# Patient Record
Sex: Female | Born: 1970 | Race: Black or African American | Hispanic: No | Marital: Single | Smoking: Current every day smoker
Health system: Southern US, Community
[De-identification: ages and names within clinical notes are randomized; demographics above are authoritative.]

## PROBLEM LIST (undated history)

## (undated) DIAGNOSIS — I10 Essential (primary) hypertension: Secondary | ICD-10-CM

## (undated) DIAGNOSIS — C4490 Unspecified malignant neoplasm of skin, unspecified: Secondary | ICD-10-CM

## (undated) DIAGNOSIS — M1990 Unspecified osteoarthritis, unspecified site: Secondary | ICD-10-CM

## (undated) DIAGNOSIS — C801 Malignant (primary) neoplasm, unspecified: Secondary | ICD-10-CM

## (undated) HISTORY — PX: CHOLECYSTECTOMY: SHX55

## (undated) HISTORY — PX: TUBAL LIGATION: SHX77

---

## 2005-11-15 ENCOUNTER — Emergency Department (HOSPITAL_COMMUNITY): Admission: EM | Admit: 2005-11-15 | Discharge: 2005-11-15 | Payer: Self-pay | Admitting: Emergency Medicine

## 2006-01-07 ENCOUNTER — Emergency Department (HOSPITAL_COMMUNITY): Admission: EM | Admit: 2006-01-07 | Discharge: 2006-01-08 | Payer: Self-pay

## 2008-08-04 ENCOUNTER — Emergency Department (HOSPITAL_BASED_OUTPATIENT_CLINIC_OR_DEPARTMENT_OTHER): Admission: EM | Admit: 2008-08-04 | Discharge: 2008-08-04 | Payer: Self-pay | Admitting: Emergency Medicine

## 2008-08-04 ENCOUNTER — Ambulatory Visit: Payer: Self-pay | Admitting: Radiology

## 2008-10-17 ENCOUNTER — Emergency Department (HOSPITAL_BASED_OUTPATIENT_CLINIC_OR_DEPARTMENT_OTHER): Admission: EM | Admit: 2008-10-17 | Discharge: 2008-10-17 | Payer: Self-pay | Admitting: Emergency Medicine

## 2009-07-10 ENCOUNTER — Emergency Department (HOSPITAL_BASED_OUTPATIENT_CLINIC_OR_DEPARTMENT_OTHER): Admission: EM | Admit: 2009-07-10 | Discharge: 2009-07-10 | Payer: Self-pay | Admitting: Emergency Medicine

## 2009-07-10 ENCOUNTER — Ambulatory Visit: Payer: Self-pay | Admitting: Diagnostic Radiology

## 2009-10-23 ENCOUNTER — Emergency Department (HOSPITAL_BASED_OUTPATIENT_CLINIC_OR_DEPARTMENT_OTHER): Admission: EM | Admit: 2009-10-23 | Discharge: 2009-10-24 | Payer: Self-pay | Admitting: Emergency Medicine

## 2009-11-06 ENCOUNTER — Emergency Department (HOSPITAL_BASED_OUTPATIENT_CLINIC_OR_DEPARTMENT_OTHER): Admission: EM | Admit: 2009-11-06 | Discharge: 2009-11-06 | Payer: Self-pay | Admitting: Emergency Medicine

## 2010-05-22 ENCOUNTER — Emergency Department (HOSPITAL_BASED_OUTPATIENT_CLINIC_OR_DEPARTMENT_OTHER)
Admission: EM | Admit: 2010-05-22 | Discharge: 2010-05-22 | Payer: Self-pay | Source: Home / Self Care | Admitting: Emergency Medicine

## 2010-08-19 LAB — WOUND CULTURE: Gram Stain: NONE SEEN

## 2010-09-10 LAB — URINE CULTURE: Colony Count: >=100000

## 2010-11-13 ENCOUNTER — Emergency Department (INDEPENDENT_AMBULATORY_CARE_PROVIDER_SITE_OTHER): Payer: Self-pay

## 2010-11-13 ENCOUNTER — Emergency Department (HOSPITAL_BASED_OUTPATIENT_CLINIC_OR_DEPARTMENT_OTHER)
Admission: EM | Admit: 2010-11-13 | Discharge: 2010-11-13 | Disposition: A | Discharge status: Home / Self Care | Payer: Self-pay | Attending: Emergency Medicine | Admitting: Emergency Medicine

## 2010-11-13 DIAGNOSIS — M25569 Pain in unspecified knee: Secondary | ICD-10-CM

## 2010-11-13 DIAGNOSIS — M79609 Pain in unspecified limb: Secondary | ICD-10-CM

## 2010-11-13 DIAGNOSIS — F17200 Nicotine dependence, unspecified, uncomplicated: Secondary | ICD-10-CM | POA: Insufficient documentation

## 2010-11-13 DIAGNOSIS — M7989 Other specified soft tissue disorders: Secondary | ICD-10-CM

## 2011-03-27 ENCOUNTER — Emergency Department (HOSPITAL_BASED_OUTPATIENT_CLINIC_OR_DEPARTMENT_OTHER)
Admission: EM | Admit: 2011-03-27 | Discharge: 2011-03-27 | Disposition: A | Discharge status: Home / Self Care | Payer: Self-pay | Attending: Emergency Medicine | Admitting: Emergency Medicine

## 2011-03-27 ENCOUNTER — Encounter: Payer: Self-pay | Admitting: *Deleted

## 2011-03-27 DIAGNOSIS — K089 Disorder of teeth and supporting structures, unspecified: Secondary | ICD-10-CM | POA: Insufficient documentation

## 2011-03-27 DIAGNOSIS — F17200 Nicotine dependence, unspecified, uncomplicated: Secondary | ICD-10-CM | POA: Insufficient documentation

## 2011-03-27 DIAGNOSIS — K0889 Other specified disorders of teeth and supporting structures: Secondary | ICD-10-CM

## 2011-03-27 MED ORDER — OXYCODONE-ACETAMINOPHEN 5-325 MG PO TABS: 1.0000 | ORAL_TABLET | Freq: Four times a day (QID) | ORAL | Status: AC | PRN

## 2011-03-27 MED ORDER — PENICILLIN V POTASSIUM 500 MG PO TABS: 500.0000 mg | ORAL_TABLET | Freq: Three times a day (TID) | ORAL | Status: AC

## 2011-03-27 NOTE — ED Notes (Signed)
Toothache onset 2 days ago

## 2011-03-27 NOTE — ED Notes (Signed)
MD at bedside. 

## 2011-03-27 NOTE — ED Provider Notes (Addendum)
History     CSN: [id number] Arrival date & time: 03/27/2011  8:22 AM   First MD Initiated Contact with Patient 03/27/11 [phone]      Chief Complaint  Patient presents with  . Dental Pain    (Consider location/radiation/quality/duration/timing/severity/associated sxs/prior treatment) HPI Patient presents with complaint of toothache. Pain started approximately 2 days ago it has been worsening. Pain is primarily in her most posterior left upper molar. There's been no drainage. No fever. No difficulty breathing or swallowing. She has taken ibuprofen which has given mild relief only. She states she does not have a dentist.  History reviewed. No pertinent past medical history.  History reviewed. No pertinent past surgical history.  History reviewed. No pertinent family history.  History  Substance Use Topics  . Smoking status: Current Everyday Smoker -- 0.5 packs/day  . Smokeless tobacco: Not on file  . Alcohol Use: Yes     occ    OB History    Grav Para Term Preterm Abortions TAB SAB Ect Mult Living                  Review of Systems ROS reviewed and otherwise negative except for mentioned in HPI   Allergies  Review of patient's allergies indicates no known allergies.  Home Medications   Current Outpatient Rx  Name Route Sig Dispense Refill  . IBUPROFEN 200 MG PO TABS Oral Take 400 mg by mouth every 6 (six) hours as needed. For toothache     . OXYCODONE-ACETAMINOPHEN 5-325 MG PO TABS Oral Take 1-2 tablets by mouth every 6 (six) hours as needed for pain. 15 tablet 0  . PENICILLIN V POTASSIUM 500 MG PO TABS Oral Take 1 tablet (500 mg total) by mouth 3 (three) times daily. 30 tablet 0    BP 117/74  Temp(Src) 98.3 F (36.8 C) (Oral)  Resp 20  SpO2 100%  LMP 03/24/2011 Vitals reviewed Physical Exam Physical Examination: General appearance - alert, well appearing, and in no distress Mental status - alert, oriented to person, place, and time Mouth - mucous membranes  moist, pharynx normal without lesions, scattered dental decay, left upper posterior molar with significant erosion, no evidence of abscess Neck - supple, no significant adenopathy Lymphatics - no palpable lymphadenopathy Chest - clear to auscultation, no wheezes, rales or rhonchi, symmetric air entry Heart - normal rate, regular rhythm, normal S1, S2, no murmurs, rubs, clicks or gallops Extremities - peripheral pulses normal, no pedal edema, no clubbing or cyanosis Skin - normal coloration and turgor, no rashes, no suspicious skin lesions noted  ED Course  Procedures (including critical care time)  Labs Reviewed - No data to display No results found.   1. Pain, dental       MDM  Pt with dental pain, no evidence of acute abscess.  Nontoxic and well hydrated appearing.  Will start abx, give pain medication in addition to the ibuprofen she has already been trying.  Encouraged her to obtain dental appointment.  Discharged with strict return precautions.  Pt agreeable with plan.        Ethelda Chick, MD 03/27/11 [id number]  Ethelda Chick, MD 03/27/11 [phone]

## 2011-05-24 ENCOUNTER — Encounter (HOSPITAL_BASED_OUTPATIENT_CLINIC_OR_DEPARTMENT_OTHER): Payer: Self-pay | Admitting: *Deleted

## 2011-05-24 ENCOUNTER — Emergency Department (HOSPITAL_BASED_OUTPATIENT_CLINIC_OR_DEPARTMENT_OTHER)
Admission: EM | Admit: 2011-05-24 | Discharge: 2011-05-24 | Disposition: A | Discharge status: Home / Self Care | Payer: Self-pay | Attending: Emergency Medicine | Admitting: Emergency Medicine

## 2011-05-24 DIAGNOSIS — Z8739 Personal history of other diseases of the musculoskeletal system and connective tissue: Secondary | ICD-10-CM | POA: Insufficient documentation

## 2011-05-24 DIAGNOSIS — K089 Disorder of teeth and supporting structures, unspecified: Secondary | ICD-10-CM | POA: Insufficient documentation

## 2011-05-24 DIAGNOSIS — K0889 Other specified disorders of teeth and supporting structures: Secondary | ICD-10-CM

## 2011-05-24 DIAGNOSIS — F17200 Nicotine dependence, unspecified, uncomplicated: Secondary | ICD-10-CM | POA: Insufficient documentation

## 2011-05-24 HISTORY — DX: Unspecified osteoarthritis, unspecified site: M19.90

## 2011-05-24 MED ORDER — IBUPROFEN 800 MG PO TABS
800.0000 mg | ORAL_TABLET | Freq: Once | ORAL | Status: AC
  Administered 2011-05-24: 800 mg via ORAL
  Filled 2011-05-24: qty 1

## 2011-05-24 MED ORDER — PENICILLIN V POTASSIUM 250 MG PO TABS
500.0000 mg | ORAL_TABLET | Freq: Once | ORAL | Status: AC
  Administered 2011-05-24: 500 mg via ORAL
  Filled 2011-05-24: qty 2

## 2011-05-24 MED ORDER — PENICILLIN V POTASSIUM 500 MG PO TABS: 500.0000 mg | ORAL_TABLET | Freq: Four times a day (QID) | ORAL | Status: AC

## 2011-05-24 MED ORDER — HYDROCODONE-ACETAMINOPHEN 5-325 MG PO TABS: 1.0000 | ORAL_TABLET | Freq: Four times a day (QID) | ORAL | Status: AC | PRN

## 2011-05-24 NOTE — ED Provider Notes (Signed)
History     CSN: [id number]  Arrival date & time 05/24/11  1540   None     Chief Complaint  Patient presents with  . Dental Pain    (Consider location/radiation/quality/duration/timing/severity/associated sxs/prior treatment) HPI Comments: Pt has no dentist.  Patient is a 40 y.o. female presenting with tooth pain. The history is provided by the patient. No language interpreter was used.  Dental PainThe primary symptoms include mouth pain. Episode onset: tooth began hurting acutely yest. The symptoms are worsening. The symptoms occur constantly.  Additional symptoms include: gum swelling.    Past Medical History  Diagnosis Date  . Arthritis     Past Surgical History  Procedure Date  . Cesarean section   . Tubal ligation   . Cholecystectomy     No family history on file.  History  Substance Use Topics  . Smoking status: Current Everyday Smoker -- 0.5 packs/day  . Smokeless tobacco: Not on file  . Alcohol Use: Yes     social    OB History    Grav Para Term Preterm Abortions TAB SAB Ect Mult Living                  Review of Systems  HENT: Positive for dental problem.   All other systems reviewed and are negative.    Allergies  Tomato  Home Medications   Current Outpatient Rx  Name Route Sig Dispense Refill  . IBUPROFEN 200 MG PO TABS Oral Take 400 mg by mouth every 6 (six) hours as needed. For toothache     . PENICILLIN V POTASSIUM 500 MG PO TABS Oral Take 500 mg by mouth 3 (three) times daily.        BP 146/92  Temp(Src) 98.3 F (36.8 C) (Oral)  Resp 18  Ht 5\' 4"  (1.626 m)  Wt 173 lb (78.472 kg)  BMI 29.70 kg/m2  SpO2 100%  LMP 05/22/2011  Physical Exam  Nursing note and vitals reviewed. Constitutional: She is oriented to person, place, and time. She appears well-developed and well-nourished. No distress.  HENT:  Head: Normocephalic and atraumatic.  Mouth/Throat: Oropharynx is clear and moist.    Eyes: EOM are normal.  Neck: Normal  range of motion. Neck supple.  Cardiovascular: Normal rate, regular rhythm and normal heart sounds.   Pulmonary/Chest: Effort normal and breath sounds normal.  Abdominal: Soft. She exhibits no distension. There is no tenderness.  Musculoskeletal: Normal range of motion.  Lymphadenopathy:    She has no cervical adenopathy.  Neurological: She is alert and oriented to person, place, and time.  Skin: Skin is warm and dry. She is not diaphoretic.  Psychiatric: She has a normal mood and affect. Judgment normal.    ED Course  Procedures (including critical care time)  Labs Reviewed - No data to display No results found.   No diagnosis found.    MDM          Worthy Rancher, PA 05/24/11 [phone]

## 2011-05-24 NOTE — ED Notes (Signed)
Mouth pain- has a "knot" upper left side of mouth x 2 days- reports difficulty eating x 3 days

## 2011-05-25 NOTE — ED Provider Notes (Signed)
Medical screening examination/treatment/procedure(s) were performed by non-physician practitioner and as supervising physician I was immediately available for consultation/collaboration.   Rolan Bucco, MD 05/25/11 [phone]

## 2012-04-06 ENCOUNTER — Encounter (HOSPITAL_BASED_OUTPATIENT_CLINIC_OR_DEPARTMENT_OTHER): Payer: Self-pay

## 2012-04-06 ENCOUNTER — Emergency Department (HOSPITAL_BASED_OUTPATIENT_CLINIC_OR_DEPARTMENT_OTHER)
Admission: EM | Admit: 2012-04-06 | Discharge: 2012-04-06 | Disposition: A | Discharge status: Home / Self Care | Payer: Self-pay | Attending: Emergency Medicine | Admitting: Emergency Medicine

## 2012-04-06 DIAGNOSIS — F17200 Nicotine dependence, unspecified, uncomplicated: Secondary | ICD-10-CM | POA: Insufficient documentation

## 2012-04-06 DIAGNOSIS — K029 Dental caries, unspecified: Secondary | ICD-10-CM | POA: Insufficient documentation

## 2012-04-06 DIAGNOSIS — Z91018 Allergy to other foods: Secondary | ICD-10-CM | POA: Insufficient documentation

## 2012-04-06 MED ORDER — PENICILLIN V POTASSIUM 250 MG PO TABS: 250.0000 mg | ORAL_TABLET | Freq: Four times a day (QID) | ORAL | Status: AC

## 2012-04-06 MED ORDER — PENICILLIN V POTASSIUM 250 MG PO TABS
250.0000 mg | ORAL_TABLET | Freq: Once | ORAL | Status: AC
  Administered 2012-04-06: 250 mg via ORAL
  Filled 2012-04-06: qty 1

## 2012-04-06 MED ORDER — BUPIVACAINE-EPINEPHRINE (PF) 0.5% -1:200000 IJ SOLN
INTRAMUSCULAR | Status: AC
  Filled 2012-04-06: qty 1.8

## 2012-04-06 MED ORDER — OXYCODONE-ACETAMINOPHEN 5-325 MG PO TABS: 1.0000 | ORAL_TABLET | Freq: Four times a day (QID) | ORAL | Status: DC | PRN

## 2012-04-06 MED ORDER — OXYCODONE-ACETAMINOPHEN 5-325 MG PO TABS
1.0000 | ORAL_TABLET | Freq: Once | ORAL | Status: AC
  Administered 2012-04-06: 1 via ORAL
  Filled 2012-04-06 (×2): qty 1

## 2012-04-06 NOTE — ED Notes (Signed)
Right upper tooth ache x 1 week 

## 2012-04-06 NOTE — ED Provider Notes (Addendum)
History     CSN: [id number]  Arrival date & time 04/06/12  2138   First MD Initiated Contact with Patient 04/06/12 2157      Chief Complaint  Patient presents with  . Dental Pain    (Consider location/radiation/quality/duration/timing/severity/associated sxs/prior treatment) Patient is a 41 y.o. female presenting with tooth pain. The history is provided by the patient.  Dental PainThe primary symptoms include mouth pain. Primary symptoms do not include fever. The symptoms began 5 to 7 days ago. The symptoms are worsening. The symptoms are recurrent. The symptoms occur constantly.  Additional symptoms include: dental sensitivity to temperature and gum tenderness. Additional symptoms do not include: gum swelling, trismus, jaw pain, facial swelling, trouble swallowing and drooling. Medical issues include: alcohol problem and periodontal disease.    Past Medical History  Diagnosis Date  . Arthritis     Past Surgical History  Procedure Date  . Cesarean section   . Tubal ligation   . Cholecystectomy   . Tubal ligation     No family history on file.  History  Substance Use Topics  . Smoking status: Current Every Day Smoker -- 0.5 packs/day  . Smokeless tobacco: Not on file  . Alcohol Use: Yes    OB History    Grav Para Term Preterm Abortions TAB SAB Ect Mult Living                  Review of Systems  Constitutional: Negative for fever.  HENT: Negative for facial swelling, drooling and trouble swallowing.   All other systems reviewed and are negative.    Allergies  Tomato  Home Medications   Current Outpatient Rx  Name  Route  Sig  Dispense  Refill  . IBUPROFEN 200 MG PO TABS   Oral   Take 400 mg by mouth every 6 (six) hours as needed. For toothache          . PENICILLIN V POTASSIUM 500 MG PO TABS   Oral   Take 500 mg by mouth 3 (three) times daily.             BP 144/97  Pulse 90  Temp 98.3 F (36.8 C) (Oral)  Resp 16  Ht 5\' 4"  (1.626 m)  Wt  175 lb (79.379 kg)  BMI 30.04 kg/m2  SpO2 100%  LMP 03/14/2012  Physical Exam  Nursing note and vitals reviewed. Constitutional: She is oriented to person, place, and time. She appears well-developed and well-nourished. No distress.  HENT:  Head: Normocephalic and atraumatic. No trismus in the jaw.  Mouth/Throat: Oropharynx is clear and moist. Dental caries present. No dental abscesses or uvula swelling.         No sublingual swelling  Eyes: EOM are normal. Pupils are equal, round, and reactive to light.  Neck: No tracheal deviation present.  Lymphadenopathy:    She has no cervical adenopathy.  Neurological: She is alert and oriented to person, place, and time.  Skin: Skin is warm and dry. No rash noted. No erythema.    ED Course  Dental Date/Time: 04/06/2012 10:14 PM Performed by: Gwyneth Sprout Authorized by: Gwyneth Sprout Consent: Verbal consent obtained. Consent given by: patient Local anesthesia used: yes Anesthesia: local infiltration (apical block) Local anesthetic: bupivacaine 0.5% with epinephrine Anesthetic total: 2 ml Patient sedated: no Patient tolerance: Patient tolerated the procedure well with no immediate complications. Comments: Significant pain relief   (including critical care time)  Labs Reviewed - No data to display No results  found.   1. Dental caries       MDM   Pt with dental caries and no facial swelling.  No signs of ludwig's angina or difficulty swallowing and no systemic symptoms.  Significant pain relief after dental block. Will treat with PCN and have pt f/u with dentist.         Gwyneth Sprout, MD 04/06/12 [id number]  Gwyneth Sprout, MD 04/06/12 2215

## 2012-07-14 ENCOUNTER — Encounter (HOSPITAL_BASED_OUTPATIENT_CLINIC_OR_DEPARTMENT_OTHER): Payer: Self-pay | Admitting: Emergency Medicine

## 2012-07-14 ENCOUNTER — Emergency Department (HOSPITAL_BASED_OUTPATIENT_CLINIC_OR_DEPARTMENT_OTHER)
Admission: EM | Admit: 2012-07-14 | Discharge: 2012-07-14 | Disposition: A | Discharge status: Home / Self Care | Payer: Self-pay | Attending: Emergency Medicine | Admitting: Emergency Medicine

## 2012-07-14 DIAGNOSIS — F17200 Nicotine dependence, unspecified, uncomplicated: Secondary | ICD-10-CM | POA: Insufficient documentation

## 2012-07-14 DIAGNOSIS — N390 Urinary tract infection, site not specified: Secondary | ICD-10-CM

## 2012-07-14 DIAGNOSIS — Z79899 Other long term (current) drug therapy: Secondary | ICD-10-CM | POA: Insufficient documentation

## 2012-07-14 DIAGNOSIS — Z3202 Encounter for pregnancy test, result negative: Secondary | ICD-10-CM | POA: Insufficient documentation

## 2012-07-14 DIAGNOSIS — B958 Unspecified staphylococcus as the cause of diseases classified elsewhere: Secondary | ICD-10-CM | POA: Insufficient documentation

## 2012-07-14 DIAGNOSIS — M129 Arthropathy, unspecified: Secondary | ICD-10-CM | POA: Insufficient documentation

## 2012-07-14 DIAGNOSIS — R300 Dysuria: Secondary | ICD-10-CM | POA: Insufficient documentation

## 2012-07-14 LAB — URINALYSIS, ROUTINE W REFLEX MICROSCOPIC
Bilirubin Urine: NEGATIVE
Glucose, UA: NEGATIVE mg/dL
Ketones, ur: NEGATIVE mg/dL
Protein, ur: 100 mg/dL — AB
Urobilinogen, UA: 1 mg/dL (ref 0.0–1.0)
pH: 7.5 (ref 5.0–8.0)

## 2012-07-14 LAB — URINE MICROSCOPIC-ADD ON

## 2012-07-14 LAB — PREGNANCY, URINE: Preg Test, Ur: NEGATIVE

## 2012-07-14 MED ORDER — SULFAMETHOXAZOLE-TMP DS 800-160 MG PO TABS
1.0000 | ORAL_TABLET | Freq: Once | ORAL | Status: AC
  Administered 2012-07-14: 1 via ORAL
  Filled 2012-07-14: qty 1

## 2012-07-14 MED ORDER — SULFAMETHOXAZOLE-TRIMETHOPRIM 800-160 MG PO TABS: 1.0000 | ORAL_TABLET | Freq: Two times a day (BID) | ORAL | Status: AC

## 2012-07-14 NOTE — ED Notes (Signed)
Feels like she is not able to empty her bladder.  Pain when she "gets to the end" x2 days. Noted some blood on the toilet paper tonight.

## 2012-07-14 NOTE — ED Notes (Signed)
MD at bedside. 

## 2012-07-14 NOTE — ED Provider Notes (Signed)
History     CSN: [id number]  Arrival date & time 07/14/12  2110   First MD Initiated Contact with Patient 07/14/12 2112      Chief Complaint  Patient presents with  . Urinary Retention  . Dysuria    (Consider location/radiation/quality/duration/timing/severity/associated sxs/prior treatment) Patient is a 41 y.o. female presenting with dysuria.  Dysuria    Pt reports two days of dysuria, feeling of incomplete voiding, pain at the end of voiding and small amount of hematuria when wiping. Denies back pain, fever, nausea or vomiting. Denies vaginal bleeding or discharge.    Past Medical History  Diagnosis Date  . Arthritis     Past Surgical History  Procedure Laterality Date  . Cesarean section    . Tubal ligation    . Cholecystectomy    . Tubal ligation      No family history on file.  History  Substance Use Topics  . Smoking status: Current Every Day Smoker -- 0.50 packs/day  . Smokeless tobacco: Not on file  . Alcohol Use: Yes    OB History   Grav Para Term Preterm Abortions TAB SAB Ect Mult Living                  Review of Systems  Genitourinary: Positive for dysuria.   All other systems reviewed and are negative except as noted in HPI.   Allergies  Tomato  Home Medications   Current Outpatient Rx  Name  Route  Sig  Dispense  Refill  . ibuprofen (ADVIL,MOTRIN) 200 MG tablet   Oral   Take 400 mg by mouth every 6 (six) hours as needed. For toothache          . oxyCODONE-acetaminophen (PERCOCET/ROXICET) 5-325 MG per tablet   Oral   Take 1-2 tablets by mouth every 6 (six) hours as needed for pain.   15 tablet   0   . penicillin v potassium (VEETID) 500 MG tablet   Oral   Take 500 mg by mouth 3 (three) times daily.             BP 157/83  Pulse 97  Temp(Src) 98.3 F (36.8 C) (Oral)  Resp 16  Ht 5\' 4"  (1.626 m)  Wt 178 lb (80.74 kg)  BMI 30.54 kg/m2  SpO2 100%  LMP 07/08/2012  Physical Exam  Nursing note and vitals  reviewed. Constitutional: She is oriented to person, place, and time. She appears well-developed and well-nourished.  HENT:  Head: Normocephalic and atraumatic.  Eyes: EOM are normal. Pupils are equal, round, and reactive to light.  Neck: Normal range of motion. Neck supple.  Cardiovascular: Normal rate, normal heart sounds and intact distal pulses.   Pulmonary/Chest: Effort normal and breath sounds normal.  Abdominal: Bowel sounds are normal. She exhibits no distension. There is no tenderness.  Musculoskeletal: Normal range of motion. She exhibits no edema and no tenderness.  Neurological: She is alert and oriented to person, place, and time. She has normal strength. No cranial nerve deficit or sensory deficit.  Skin: Skin is warm and dry. No rash noted.  Psychiatric: She has a normal mood and affect.    ED Course  Procedures (including critical care time)  Labs Reviewed  URINALYSIS, ROUTINE W REFLEX MICROSCOPIC - Abnormal; Notable for the following:    Color, Urine AMBER (*)    APPearance TURBID (*)    Hgb urine dipstick LARGE (*)    Protein, ur 100 (*)    Leukocytes, UA  MODERATE (*)    All other components within normal limits  URINE MICROSCOPIC-ADD ON - Abnormal; Notable for the following:    Bacteria, UA MANY (*)    All other components within normal limits  URINE CULTURE  PREGNANCY, URINE   No results found.   No diagnosis found.    MDM  UA consistent with UTI, doubt renal stone or nephritic syndrome. Will treat with Bactrim pending culture.         Charles B. Bernette Mayers, MD 07/14/12 2147

## 2012-07-17 LAB — URINE CULTURE

## 2012-07-18 ENCOUNTER — Telehealth (HOSPITAL_COMMUNITY): Payer: Self-pay | Admitting: Emergency Medicine

## 2012-07-18 NOTE — ED Notes (Signed)
Patient has +Urine culture. Checking to see if appropriately treated. °

## 2012-07-18 NOTE — ED Notes (Signed)
+  Urine. Patient given Septra. No sensitivity listed. Chart sent to EDP office for review. °

## 2012-07-21 NOTE — ED Notes (Signed)
No further treatment needed per Felicie Morn.

## 2013-01-21 ENCOUNTER — Encounter (HOSPITAL_BASED_OUTPATIENT_CLINIC_OR_DEPARTMENT_OTHER): Payer: Self-pay | Admitting: Emergency Medicine

## 2013-01-21 ENCOUNTER — Emergency Department (HOSPITAL_BASED_OUTPATIENT_CLINIC_OR_DEPARTMENT_OTHER): Payer: Self-pay

## 2013-01-21 ENCOUNTER — Emergency Department (HOSPITAL_BASED_OUTPATIENT_CLINIC_OR_DEPARTMENT_OTHER)
Admission: EM | Admit: 2013-01-21 | Discharge: 2013-01-22 | Disposition: A | Discharge status: Home / Self Care | Payer: Self-pay | Attending: Emergency Medicine | Admitting: Emergency Medicine

## 2013-01-21 DIAGNOSIS — M1710 Unilateral primary osteoarthritis, unspecified knee: Secondary | ICD-10-CM | POA: Insufficient documentation

## 2013-01-21 DIAGNOSIS — F17200 Nicotine dependence, unspecified, uncomplicated: Secondary | ICD-10-CM | POA: Insufficient documentation

## 2013-01-21 DIAGNOSIS — M25569 Pain in unspecified knee: Secondary | ICD-10-CM | POA: Insufficient documentation

## 2013-01-21 DIAGNOSIS — R609 Edema, unspecified: Secondary | ICD-10-CM | POA: Insufficient documentation

## 2013-01-21 DIAGNOSIS — M25562 Pain in left knee: Secondary | ICD-10-CM

## 2013-01-21 DIAGNOSIS — M1712 Unilateral primary osteoarthritis, left knee: Secondary | ICD-10-CM

## 2013-01-21 NOTE — ED Notes (Signed)
Pain, swelling, and "popping" of left knee since yesterday.

## 2013-01-22 ENCOUNTER — Encounter (HOSPITAL_BASED_OUTPATIENT_CLINIC_OR_DEPARTMENT_OTHER): Payer: Self-pay | Admitting: Emergency Medicine

## 2013-01-22 MED ORDER — IBUPROFEN 600 MG PO TABS: 600.0000 mg | ORAL_TABLET | Freq: Three times a day (TID) | ORAL | Status: DC

## 2013-01-22 MED ORDER — IBUPROFEN 400 MG PO TABS
400.0000 mg | ORAL_TABLET | Freq: Once | ORAL | Status: AC
  Administered 2013-01-22: 400 mg via ORAL
  Filled 2013-01-22: qty 1

## 2013-01-22 NOTE — Discharge Instructions (Signed)
Knee Pain  The knee is the complex joint between your thigh and your lower leg. It is made up of bones, tendons, ligaments, and cartilage. The bones that make up the knee are:   The femur in the thigh.   The tibia and fibula in the lower leg.   The patella or kneecap riding in the groove on the lower femur.  CAUSES   Knee pain is a common complaint with many causes. A few of these causes are:   Injury, such as:   A ruptured ligament or tendon injury.   Torn cartilage.   Medical conditions, such as:   Gout   Arthritis   Infections   Overuse, over training or overdoing a physical activity.  Knee pain can be minor or severe. Knee pain can accompany debilitating injury. Minor knee problems often respond well to self-care measures or get well on their own. More serious injuries may need medical intervention or even surgery.  SYMPTOMS  The knee is complex. Symptoms of knee problems can vary widely. Some of the problems are:   Pain with movement and weight bearing.   Swelling and tenderness.   Buckling of the knee.   Inability to straighten or extend your knee.   Your knee locks and you cannot straighten it.   Warmth and redness with pain and fever.   Deformity or dislocation of the kneecap.  DIAGNOSIS   Determining what is wrong may be very straight forward such as when there is an injury. It can also be challenging because of the complexity of the knee. Tests to make a diagnosis may include:   Your caregiver taking a history and doing a physical exam.   Routine X-rays can be used to rule out other problems. X-rays will not reveal a cartilage tear. Some injuries of the knee can be diagnosed by:   Arthroscopy a surgical technique by which a small video camera is inserted through tiny incisions on the sides of the knee. This procedure is used to examine and repair internal knee joint problems. Tiny instruments can be used during arthroscopy to repair the torn knee cartilage (meniscus).   Arthrography  is a radiology technique. A contrast liquid is directly injected into the knee joint. Internal structures of the knee joint then become visible on X-ray film.   An MRI scan is a non x-ray radiology procedure in which magnetic fields and a computer produce two- or three-dimensional images of the inside of the knee. Cartilage tears are often visible using an MRI scanner. MRI scans have largely replaced arthrography in diagnosing cartilage tears of the knee.   Blood work.   Examination of the fluid that helps to lubricate the knee joint (synovial fluid). This is done by taking a sample out using a needle and a syringe.  TREATMENT  The treatment of knee problems depends on the cause. Some of these treatments are:   Depending on the injury, proper casting, splinting, surgery or physical therapy care will be needed.   Give yourself adequate recovery time. Do not overuse your joints. If you begin to get sore during workout routines, back off. Slow down or do fewer repetitions.   For repetitive activities such as cycling or running, maintain your strength and nutrition.   Alternate muscle groups. For example if you are a weight lifter, work the upper body on one day and the lower body the next.   Either tight or weak muscles do not give the proper support for your   knee. Tight or weak muscles do not absorb the stress placed on the knee joint. Keep the muscles surrounding the knee strong.   Take care of mechanical problems.   If you have flat feet, orthotics or special shoes may help. See your caregiver if you need help.   Arch supports, sometimes with wedges on the inner or outer aspect of the heel, can help. These can shift pressure away from the side of the knee most bothered by osteoarthritis.   A brace called an "unloader" brace also may be used to help ease the pressure on the most arthritic side of the knee.   If your caregiver has prescribed crutches, braces, wraps or ice, use as directed. The acronym for  this is PRICE. This means protection, rest, ice, compression and elevation.   Nonsteroidal anti-inflammatory drugs (NSAID's), can help relieve pain. But if taken immediately after an injury, they may actually increase swelling. Take NSAID's with food in your stomach. Stop them if you develop stomach problems. Do not take these if you have a history of ulcers, stomach pain or bleeding from the bowel. Do not take without your caregiver's approval if you have problems with fluid retention, heart failure, or kidney problems.   For ongoing knee problems, physical therapy may be helpful.   Glucosamine and chondroitin are over-the-counter dietary supplements. Both may help relieve the pain of osteoarthritis in the knee. These medicines are different from the usual anti-inflammatory drugs. Glucosamine may decrease the rate of cartilage destruction.   Injections of a corticosteroid drug into your knee joint may help reduce the symptoms of an arthritis flare-up. They may provide pain relief that lasts a few months. You may have to wait a few months between injections. The injections do have a small increased risk of infection, water retention and elevated blood sugar levels.   Hyaluronic acid injected into damaged joints may ease pain and provide lubrication. These injections may work by reducing inflammation. A series of shots may give relief for as long as 6 months.   Topical painkillers. Applying certain ointments to your skin may help relieve the pain and stiffness of osteoarthritis. Ask your pharmacist for suggestions. Many over the-counter products are approved for temporary relief of arthritis pain.   In some countries, doctors often prescribe topical NSAID's for relief of chronic conditions such as arthritis and tendinitis. A review of treatment with NSAID creams found that they worked as well as oral medications but without the serious side effects.  PREVENTION   Maintain a healthy weight. Extra pounds put  more strain on your joints.   Get strong, stay limber. Weak muscles are a common cause of knee injuries. Stretching is important. Include flexibility exercises in your workouts.   Be smart about exercise. If you have osteoarthritis, chronic knee pain or recurring injuries, you may need to change the way you exercise. This does not mean you have to stop being active. If your knees ache after jogging or playing basketball, consider switching to swimming, water aerobics or other low-impact activities, at least for a few days a week. Sometimes limiting high-impact activities will provide relief.   Make sure your shoes fit well. Choose footwear that is right for your sport.   Protect your knees. Use the proper gear for knee-sensitive activities. Use kneepads when playing volleyball or laying carpet. Buckle your seat belt every time you drive. Most shattered kneecaps occur in car accidents.   Rest when you are tired.  SEEK MEDICAL CARE IF:     You have knee pain that is continual and does not seem to be getting better.   SEEK IMMEDIATE MEDICAL CARE IF:   Your knee joint feels hot to the touch and you have a high fever.  MAKE SURE YOU:    Understand these instructions.   Will watch your condition.   Will get help right away if you are not doing well or get worse.  Document Released: 03/16/2007 Document Revised: 08/11/2011 Document Reviewed: 03/16/2007  [employer] Patient Information 2014 [employer]

## 2013-01-22 NOTE — ED Provider Notes (Signed)
CSN: [id number]     Arrival date & time 01/21/13  2319 History     First MD Initiated Contact with Patient 01/22/13 0026     Chief Complaint  Patient presents with  . Knee Pain   (Consider location/radiation/quality/duration/timing/severity/associated sxs/prior Treatment) HPI Comments: Pt with swelling down to ankles and dorsum of left foot, tender more in back and behind knee  Patient is a 42 y.o. female presenting with knee pain. The history is provided by the patient.  Knee Pain Location:  Knee Time since incident:  1 day Injury: yes   Knee location:  L knee Pain details:    Quality:  Shooting and sharp   Radiates to:  Does not radiate   Severity:  Moderate   Onset quality:  Sudden   Duration:  1 day   Timing:  Constant   Progression:  Unchanged Dislocation: no   Prior injury to area:  Unable to specify Relieved by:  Rest Worsened by:  Activity, extension, flexion and bearing weight Ineffective treatments:  Arthritis medication Associated symptoms: stiffness and swelling   Associated symptoms: no fever and no muscle weakness   Risk factors comment:  Arthritis   Past Medical History  Diagnosis Date  . Arthritis    Past Surgical History  Procedure Laterality Date  . Cesarean section    . Tubal ligation    . Cholecystectomy    . Tubal ligation     History reviewed. No pertinent family history. History  Substance Use Topics  . Smoking status: Current Every Day Smoker -- 0.50 packs/day  . Smokeless tobacco: Not on file  . Alcohol Use: Yes   OB History   Grav Para Term Preterm Abortions TAB SAB Ect Mult Living                 Review of Systems  Constitutional: Negative for fever.  Respiratory: Negative for shortness of breath.   Cardiovascular: Positive for leg swelling. Negative for chest pain.  Musculoskeletal: Positive for joint swelling, arthralgias and stiffness.  Skin: Negative for color change and wound.    Allergies  Tomato  Home Medications    Current Outpatient Rx  Name  Route  Sig  Dispense  Refill  . ibuprofen (ADVIL,MOTRIN) 200 MG tablet   Oral   Take 400 mg by mouth every 6 (six) hours as needed. For toothache          . ibuprofen (ADVIL,MOTRIN) 600 MG tablet   Oral   Take 1 tablet (600 mg total) by mouth every 8 (eight) hours. Take with food   21 tablet   0   . oxyCODONE-acetaminophen (PERCOCET/ROXICET) 5-325 MG per tablet   Oral   Take 1-2 tablets by mouth every 6 (six) hours as needed for pain.   15 tablet   0   . penicillin v potassium (VEETID) 500 MG tablet   Oral   Take 500 mg by mouth 3 (three) times daily.            BP 134/92  Pulse 88  Temp(Src) 98.4 F (36.9 C) (Oral)  Resp 16  Ht 5\' 4"  (1.626 m)  Wt 173 lb (78.472 kg)  BMI 29.68 kg/m2  SpO2 100%  LMP 01/14/2013 Physical Exam  Nursing note and vitals reviewed. Constitutional: She is oriented to person, place, and time. She appears well-developed and well-nourished. No distress.  HENT:  Head: Normocephalic and atraumatic.  Pulmonary/Chest: Effort normal. No respiratory distress.  Musculoskeletal: She exhibits edema and tenderness.  Left knee: She exhibits decreased range of motion and swelling. She exhibits normal alignment, no LCL laxity and normal patellar mobility. Tenderness found.  Edema is dependent, worse in LLE than right, no calf tenderness, neg Homan's  Neurological: She is alert and oriented to person, place, and time. She exhibits normal muscle tone. Coordination normal.  Skin: Skin is warm and dry. No rash noted. No erythema. No pallor.  Psychiatric: She has a normal mood and affect.    ED Course   Procedures (including critical care time)  Labs Reviewed - No data to display Dg Knee Complete 4 Views Left  01/22/2013   CLINICAL DATA:  Left knee pain. No known injury.  EXAM: LEFT KNEE - COMPLETE 4+ VIEW  COMPARISON:  11/13/2010 at [facility].  FINDINGS: Mild spur formation involving all 3 joint  compartments. No knee effusion.  IMPRESSION: Stable mild degenerative changes. No acute abnormality.   Electronically Signed   By: Gordan Payment   On: 01/22/2013 00:34   1. Knee pain, acute, left   2. Arthritis of left knee    ra sat is 100% and I interpret to be normal MDM  Pt with arthritis on plain films, no effusion.  Tender to back of knee with some swelling down to ankle, possible Quintanar's cyst.  Recommend RICE at home, will refer to Dr. Pearletha Forge and have pt return tomorrow for U/S to r/o DVT in the AM.    Gavin Pound. Oletta Lamas, MD 01/22/13 [id number]

## 2013-03-20 ENCOUNTER — Emergency Department (HOSPITAL_BASED_OUTPATIENT_CLINIC_OR_DEPARTMENT_OTHER)
Admission: EM | Admit: 2013-03-20 | Discharge: 2013-03-20 | Disposition: A | Discharge status: Home / Self Care | Payer: Self-pay | Attending: Emergency Medicine | Admitting: Emergency Medicine

## 2013-03-20 ENCOUNTER — Encounter (HOSPITAL_BASED_OUTPATIENT_CLINIC_OR_DEPARTMENT_OTHER): Payer: Self-pay | Admitting: Emergency Medicine

## 2013-03-20 DIAGNOSIS — H109 Unspecified conjunctivitis: Secondary | ICD-10-CM | POA: Insufficient documentation

## 2013-03-20 DIAGNOSIS — Z79899 Other long term (current) drug therapy: Secondary | ICD-10-CM | POA: Insufficient documentation

## 2013-03-20 DIAGNOSIS — F17200 Nicotine dependence, unspecified, uncomplicated: Secondary | ICD-10-CM | POA: Insufficient documentation

## 2013-03-20 DIAGNOSIS — M129 Arthropathy, unspecified: Secondary | ICD-10-CM | POA: Insufficient documentation

## 2013-03-20 DIAGNOSIS — Z791 Long term (current) use of non-steroidal anti-inflammatories (NSAID): Secondary | ICD-10-CM | POA: Insufficient documentation

## 2013-03-20 DIAGNOSIS — H538 Other visual disturbances: Secondary | ICD-10-CM | POA: Insufficient documentation

## 2013-03-20 MED ORDER — FLUORESCEIN SODIUM 1 MG OP STRP
ORAL_STRIP | OPHTHALMIC | Status: AC
  Administered 2013-03-20: 1 via OPHTHALMIC
  Filled 2013-03-20: qty 1

## 2013-03-20 MED ORDER — FLUORESCEIN-BENOXINATE 0.25-0.4 % OP SOLN
1.0000 [drp] | Freq: Once | OPHTHALMIC | Status: DC
  Filled 2013-03-20: qty 5

## 2013-03-20 MED ORDER — TETRACAINE HCL 0.5 % OP SOLN
1.0000 [drp] | Freq: Once | OPHTHALMIC | Status: AC
  Administered 2013-03-20: 1 [drp] via OPHTHALMIC

## 2013-03-20 MED ORDER — FLUORESCEIN SODIUM 1 MG OP STRP
1.0000 | ORAL_STRIP | Freq: Once | OPHTHALMIC | Status: AC
  Administered 2013-03-20: 1 via OPHTHALMIC

## 2013-03-20 MED ORDER — TOBRAMYCIN 0.3 % OP SOLN: 1.0000 [drp] | OPHTHALMIC | Status: DC

## 2013-03-20 MED ORDER — TETRACAINE HCL 0.5 % OP SOLN
OPHTHALMIC | Status: AC
  Administered 2013-03-20: 1 [drp] via OPHTHALMIC
  Filled 2013-03-20: qty 2

## 2013-03-20 NOTE — ED Provider Notes (Signed)
CSN: [id number]     Arrival date & time 03/20/13  1917 History   First MD Initiated Contact with Patient 03/20/13 2040     Chief Complaint  Patient presents with  . Eye Drainage   (Consider location/radiation/quality/duration/timing/severity/associated sxs/prior Treatment) HPI Complains of redness and drainage from left thigh onset yesterday with Slight amount of pain and feeling like sand in her eye onset yesterday. She denies injury. No treatment prior to coming here. She is mildly blurred in left eye. She states she had yellow drainage on the eyelashes upon awakening this morning. No treatment prior to coming here nothing makes symptoms but are worse. No other associated symptoms. Past Medical History  Diagnosis Date  . Arthritis    Past Surgical History  Procedure Laterality Date  . Cesarean section    . Tubal ligation    . Cholecystectomy    . Tubal ligation     History reviewed. No pertinent family history. History  Substance Use Topics  . Smoking status: Current Every Day Smoker -- 0.50 packs/day    Types: Cigarettes  . Smokeless tobacco: Not on file  . Alcohol Use: Yes   OB History   Grav Para Term Preterm Abortions TAB SAB Ect Mult Living                 Review of Systems  Constitutional: Negative.   Eyes: Positive for pain, discharge and visual disturbance.    Allergies  Tomato  Home Medications   Current Outpatient Rx  Name  Route  Sig  Dispense  Refill  . ibuprofen (ADVIL,MOTRIN) 200 MG tablet   Oral   Take 400 mg by mouth every 6 (six) hours as needed. For toothache          . ibuprofen (ADVIL,MOTRIN) 600 MG tablet   Oral   Take 1 tablet (600 mg total) by mouth every 8 (eight) hours. Take with food   21 tablet   0   . oxyCODONE-acetaminophen (PERCOCET/ROXICET) 5-325 MG per tablet   Oral   Take 1-2 tablets by mouth every 6 (six) hours as needed for pain.   15 tablet   0   . penicillin v potassium (VEETID) 500 MG tablet   Oral   Take 500  mg by mouth 3 (three) times daily.            BP 131/82  Pulse 78  Temp(Src) 98.6 F (37 C) (Oral)  Resp 18  Ht 5\' 4"  (1.626 m)  Wt 168 lb (76.204 kg)  BMI 28.82 kg/m2  SpO2 100%  LMP 03/12/2013 Physical Exam  Nursing note and vitals reviewed. Constitutional: She is oriented to person, place, and time. She appears well-developed and well-nourished. No distress.  HENT:  Head: Normocephalic and atraumatic.  Eyes: Left eye exhibits discharge.   Left eyeWith sub-conjunctival erythema. Slight amount of clear discharge. Pupils equal react to light extraocular muscles intact. Fluorescein negative. No pain on extraocular movement.  rigth eye normal  Cardiovascular: Normal rate.   Pulmonary/Chest: No respiratory distress.  Abdominal: She exhibits no distension.  Musculoskeletal: Normal range of motion.  Neurological: She is oriented to person, place, and time.  Skin: Skin is dry.  Psychiatric: She has a normal mood and affect.    ED Course  Procedures (including critical care time) Labs Review Labs Reviewed - No data to display Imaging Review No results found.  EKG Interpretation   None       MDM  No diagnosis found. Plan prescription  for tobramycin eyedrops Referral Dr. Allyne Gee as needed Diagnosis conjunctivitis left eye    Doug Sou, MD 03/20/13 2104

## 2013-03-20 NOTE — ED Notes (Signed)
Woods lamp placed in room 

## 2013-03-20 NOTE — ED Notes (Signed)
Pt noticed eye was hurting yesterday, felt like there are rocks in eye, no injury noted, pt denies any thing that could have gotten in eye, does not wear contacts, now eye is draining clear drainage when she lies down or bends forward,

## 2013-03-22 ENCOUNTER — Encounter (HOSPITAL_BASED_OUTPATIENT_CLINIC_OR_DEPARTMENT_OTHER): Payer: Self-pay | Admitting: Emergency Medicine

## 2013-03-22 ENCOUNTER — Emergency Department (HOSPITAL_BASED_OUTPATIENT_CLINIC_OR_DEPARTMENT_OTHER)
Admission: EM | Admit: 2013-03-22 | Discharge: 2013-03-22 | Disposition: A | Discharge status: Home / Self Care | Payer: Self-pay | Attending: Emergency Medicine | Admitting: Emergency Medicine

## 2013-03-22 DIAGNOSIS — M129 Arthropathy, unspecified: Secondary | ICD-10-CM | POA: Insufficient documentation

## 2013-03-22 DIAGNOSIS — Z792 Long term (current) use of antibiotics: Secondary | ICD-10-CM | POA: Insufficient documentation

## 2013-03-22 DIAGNOSIS — F17200 Nicotine dependence, unspecified, uncomplicated: Secondary | ICD-10-CM | POA: Insufficient documentation

## 2013-03-22 DIAGNOSIS — H109 Unspecified conjunctivitis: Secondary | ICD-10-CM | POA: Insufficient documentation

## 2013-03-22 MED ORDER — KETOROLAC TROMETHAMINE 0.5 % OP SOLN: 1.0000 [drp] | Freq: Four times a day (QID) | OPHTHALMIC | Status: DC

## 2013-03-22 NOTE — ED Provider Notes (Signed)
Medical screening examination/treatment/procedure(s) were performed by non-physician practitioner and as supervising physician I was immediately available for consultation/collaboration.   Shelda Jakes, MD 03/22/13 1434

## 2013-03-22 NOTE — ED Provider Notes (Signed)
CSN: [id number]     Arrival date & time 03/22/13  1232 History   First MD Initiated Contact with Patient 03/22/13 1233     Chief Complaint  Patient presents with  . Eye Drainage   (Consider location/radiation/quality/duration/timing/severity/associated sxs/prior Treatment) The history is provided by the patient and medical records.   This is a 42 y.o female with past medical history significant for arthritis, presenting to the ED for left redness, swelling, and drainage. Patient was seen in the ED 2 days ago and given antibiotic eyedrops. States she has been using the drops for the past 2 days as directed without noted improvement. Patient states her left eye has continued to tear and have purulent drainage.  States "it feels like there is a film on my eye".  States her vision is somewhat blurred because of all the tearing and drainage, once she clears her eyes vision is normal. Uses reading glasses, no corrective lenses.  Has had some increased swelling below left eye but none of the eyelids.  She denies any trauma to the eye. No foreign body or chemical exposure.  No fevers, sweats, or chills.  Has also been applying warm and cold compresses which does seem too ease irritation temporarily.  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    Past Medical History  Diagnosis Date  . Arthritis    Past Surgical History  Procedure Laterality Date  . Cesarean section    . Tubal ligation    . Cholecystectomy    . Tubal ligation     No family history on file. History  Substance Use Topics  . Smoking status: Current Every Day Smoker -- 0.50 packs/day    Types: Cigarettes  . Smokeless tobacco: Not on file  . Alcohol Use: Yes   OB History   Grav Para Term Preterm Abortions TAB SAB Ect Mult Living                 Review of Systems  Eyes: Positive for discharge, redness and itching.  All  other systems reviewed and are negative.    Allergies  Tomato  Home Medications   Current Outpatient Rx  Name  Route  Sig  Dispense  Refill  . ibuprofen (ADVIL,MOTRIN) 200 MG tablet   Oral   Take 400 mg by mouth every 6 (six) hours as needed. For toothache          . ibuprofen (ADVIL,MOTRIN) 600 MG tablet   Oral   Take 1 tablet (600 mg total) by mouth every 8 (eight) hours. Take with food   21 tablet   0   . oxyCODONE-acetaminophen (PERCOCET/ROXICET) 5-325 MG per tablet   Oral   Take 1-2 tablets by mouth every 6 (six) hours as needed for pain.   15 tablet   0   . penicillin v potassium (VEETID) 500 MG tablet   Oral   Take 500 mg by mouth 3 (three) times daily.           Marland Kitchen tobramycin (TOBREX) 0.3 % ophthalmic solution   Left Eye   Place 1 drop into the left eye every 4 (four) hours.   5 mL   0    BP 131/92  Temp(Src) 98.5 F (36.9 C) (Oral)  Resp 20  SpO2 100%  LMP 03/12/2013  Physical Exam  Nursing note and vitals reviewed. Constitutional:  She is oriented to person, place, and time. She appears well-developed and well-nourished. No distress.  HENT:  Head: Normocephalic and atraumatic.  Mouth/Throat: Oropharynx is clear and moist.  Eyes: EOM are normal. Pupils are equal, round, and reactive to light. Left conjunctiva is injected. Left conjunctiva has no hemorrhage. Right eye exhibits normal extraocular motion. Left eye exhibits normal extraocular motion.  Left eye-- mild swelling infraorbitally without lid involvement; conjunctiva injected with tearing and purulent drainage; PERRL; EOM intact and non-painful; no upper lid edema; no FB Right eye-- normal  Neck: Normal range of motion. Neck supple.  Cardiovascular: Normal rate, regular rhythm and normal heart sounds.   Pulmonary/Chest: Effort normal and breath sounds normal. No respiratory distress. She has no wheezes.  Musculoskeletal: Normal range of motion.  Neurological: She is alert and oriented to  person, place, and time.  Skin: Skin is warm and dry. She is not diaphoretic.  Psychiatric: She has a normal mood and affect.    ED Course  Procedures (including critical care time) Labs Review Labs Reviewed - No data to display Imaging Review No results found.  EKG Interpretation   None       MDM   1. Conjunctivitis    Negative fluorescein at last ED visit, no upper lid edema, EOM intact and non-painful.  I doubt orbital or pre-septal cellulitis at this time. Will increased tobramycin dosing to [facility] for the next 2 days, add acular for comfort.  If no improvement, FU with opthalmology, Dr. Allyne Gee.  Discussed plan with pt, she agreed.  Return precautions advised.  Garlon Hatchet, PA-C 03/22/13 1337

## 2013-03-22 NOTE — ED Notes (Signed)
Patient states she woke up three days ago with left eye swelling and drainage.  States she was seen here two days ago and given antibiotic eye drops.  States the swelling and redness are worse.

## 2013-06-07 ENCOUNTER — Encounter (HOSPITAL_BASED_OUTPATIENT_CLINIC_OR_DEPARTMENT_OTHER): Payer: Self-pay | Admitting: Emergency Medicine

## 2013-06-07 ENCOUNTER — Emergency Department (HOSPITAL_BASED_OUTPATIENT_CLINIC_OR_DEPARTMENT_OTHER)
Admission: EM | Admit: 2013-06-07 | Discharge: 2013-06-07 | Disposition: A | Discharge status: Home / Self Care | Payer: Self-pay | Attending: Emergency Medicine | Admitting: Emergency Medicine

## 2013-06-07 DIAGNOSIS — Z79899 Other long term (current) drug therapy: Secondary | ICD-10-CM | POA: Insufficient documentation

## 2013-06-07 DIAGNOSIS — Z792 Long term (current) use of antibiotics: Secondary | ICD-10-CM | POA: Insufficient documentation

## 2013-06-07 DIAGNOSIS — F17200 Nicotine dependence, unspecified, uncomplicated: Secondary | ICD-10-CM | POA: Insufficient documentation

## 2013-06-07 DIAGNOSIS — N764 Abscess of vulva: Secondary | ICD-10-CM | POA: Insufficient documentation

## 2013-06-07 DIAGNOSIS — M129 Arthropathy, unspecified: Secondary | ICD-10-CM | POA: Insufficient documentation

## 2013-06-07 MED ORDER — HYDROCODONE-ACETAMINOPHEN 5-325 MG PO TABS: 2.0000 | ORAL_TABLET | ORAL | Status: DC | PRN

## 2013-06-07 NOTE — ED Provider Notes (Signed)
CSN: [id number]     Arrival date & time 06/07/13  4098 History   First MD Initiated Contact with Patient 06/07/13 [phone]     Chief Complaint  Patient presents with  . Abscess    HPI Patient presents with a abscess and boil in her perineal area that spontaneously opened and drained 2 days ago.  She has been treating that with some triple antibiotic ointment and sitz baths.  She also notes another area close to the original abscess.  It seems to be slightly tender and swollen.  It is not spontaneously open before.  Patient's had history of these abscesses which usually spontaneously open.  Patient denies any fever or chills. Past Medical History  Diagnosis Date  . Arthritis    Past Surgical History  Procedure Laterality Date  . Cesarean section    . Tubal ligation    . Cholecystectomy    . Tubal ligation     No family history on file. History  Substance Use Topics  . Smoking status: Current Every Day Smoker -- 0.50 packs/day    Types: Cigarettes  . Smokeless tobacco: Not on file  . Alcohol Use: Yes   OB History   Grav Para Term Preterm Abortions TAB SAB Ect Mult Living                 Review of Systems All other systems reviewed and are negati Allergies  Tomato  Home Medications   Current Outpatient Rx  Name  Route  Sig  Dispense  Refill  . HYDROcodone-acetaminophen (NORCO/VICODIN) 5-325 MG per tablet   Oral   Take 2 tablets by mouth every 4 (four) hours as needed.   10 tablet   0   . ibuprofen (ADVIL,MOTRIN) 200 MG tablet   Oral   Take 400 mg by mouth every 6 (six) hours as needed. For toothache          . ibuprofen (ADVIL,MOTRIN) 600 MG tablet   Oral   Take 1 tablet (600 mg total) by mouth every 8 (eight) hours. Take with food   21 tablet   0   . ketorolac (ACULAR) 0.5 % ophthalmic solution   Left Eye   Place 1 drop into the left eye 4 (four) times daily.   10 mL   0   . oxyCODONE-acetaminophen (PERCOCET/ROXICET) 5-325 MG per tablet   Oral   Take 1-2  tablets by mouth every 6 (six) hours as needed for pain.   15 tablet   0   . penicillin v potassium (VEETID) 500 MG tablet   Oral   Take 500 mg by mouth 3 (three) times daily.           Marland Kitchen tobramycin (TOBREX) 0.3 % ophthalmic solution   Left Eye   Place 1 drop into the left eye every 4 (four) hours.   5 mL   0    BP 135/72  Pulse 98  Temp(Src) 97.9 F (36.6 C) (Oral)  Resp 16  Ht 5\' 4"  (1.626 m)  Wt 173 lb (78.472 kg)  BMI 29.68 kg/m2  SpO2 100%  LMP 05/31/2013 Physical Exam  Nursing note and vitals reviewed. Constitutional: She is oriented to person, place, and time. She appears well-developed and well-nourished. No distress.  HENT:  Head: Normocephalic and atraumatic.  Eyes: Pupils are equal, round, and reactive to light.  Neck: Normal range of motion.  Cardiovascular: Normal rate and intact distal pulses.   Pulmonary/Chest: No respiratory distress.  Abdominal: Normal  appearance. She exhibits no distension.  Genitourinary:     Musculoskeletal: Normal range of motion.  Neurological: She is alert and oriented to person, place, and time. No cranial nerve deficit.  Skin: Skin is warm and dry. No rash noted.  Psychiatric: She has a normal mood and affect. Her behavior is normal.    ED Course  Procedures (including critical care time) Labs Review Labs Reviewed - No data to display Imaging Review No results found.    MDM   1. Labial abscess        Dot Lanes, MD 06/07/13 [phone]

## 2013-06-07 NOTE — Discharge Instructions (Signed)
Abscess An abscess (boil or furuncle) is an infected area on or under the skin. This area is filled with yellowish-white fluid (pus) and other material (debris). HOME CARE   Only take medicines as told by your doctor.  If you were given antibiotic medicine, take it as directed. Finish the medicine even if you start to feel better.  If gauze is used, follow your doctor's directions for changing the gauze.  To avoid spreading the infection:  Keep your abscess covered with a bandage.  Wash your hands well.  Do not share personal care items, towels, or whirlpools with others.  Avoid skin contact with others.  Keep your skin and clothes clean around the abscess.  Keep all doctor visits as told. GET HELP RIGHT AWAY IF:   You have more pain, puffiness (swelling), or redness in the wound site.  You have more fluid or blood coming from the wound site.  You have muscle aches, chills, or you feel sick.  You have a fever. MAKE SURE YOU:   Understand these instructions.  Will watch your condition.  Will get help right away if you are not doing well or get worse. Document Released: 11/05/2007 Document Revised: 11/18/2011 Document Reviewed: 08/01/2011 [facility] Patient Information 2014 [place].  Vulvar Abscess  The vulva is made up of the large and small flaps of skin around the vagina opening. A vulvar abscess is an infected sac of yellowish white fluid (pus) in the skin flaps. Your doctor may make a small cut in the skin to drain the vulvar abscess.  HOME CARE  Only take medicine as told by your doctor.  Soak or take a warm water bath (sitz bath) 3 to 4 times a day for 15 to 20 minutes.  After you pee (urinate), always wipe from front to back.  Clean the vulvar abscess with soap and warm water. Do this after going to the bathroom.  Wear loose-fitting clothing.  Do not have sex until the vulvar abscess is gone or as told by your doctor. GET HELP RIGHT AWAY IF:    You have a temperature by mouth above 102 F (38.9 C).  The vulva area becomes more painful, puffy (swollen), or red.  You have fluid coming from the vulva area that is red or tan.  You have pain when you pee or have a hard time peeing. MAKE SURE YOU:  Understand these instructions.  Will watch your condition.  Will get help if you are not doing well or get worse. Document Released: 08/15/2008 Document Revised: 08/11/2011 Document Reviewed: 08/15/2008 [facility] Patient Information 2014 [place], [place].

## 2013-06-07 NOTE — ED Notes (Signed)
Pt c/o boil to buttock x 2 wks. Pt reports h/o same.

## 2014-06-03 ENCOUNTER — Emergency Department (HOSPITAL_BASED_OUTPATIENT_CLINIC_OR_DEPARTMENT_OTHER)
Admission: EM | Admit: 2014-06-03 | Discharge: 2014-06-03 | Disposition: A | Discharge status: Home / Self Care | Payer: 59 | Attending: Emergency Medicine | Admitting: Emergency Medicine

## 2014-06-03 ENCOUNTER — Encounter (HOSPITAL_BASED_OUTPATIENT_CLINIC_OR_DEPARTMENT_OTHER): Payer: Self-pay | Admitting: *Deleted

## 2014-06-03 DIAGNOSIS — Z792 Long term (current) use of antibiotics: Secondary | ICD-10-CM | POA: Insufficient documentation

## 2014-06-03 DIAGNOSIS — Z79899 Other long term (current) drug therapy: Secondary | ICD-10-CM | POA: Diagnosis not present

## 2014-06-03 DIAGNOSIS — Z202 Contact with and (suspected) exposure to infections with a predominantly sexual mode of transmission: Secondary | ICD-10-CM | POA: Diagnosis present

## 2014-06-03 DIAGNOSIS — A599 Trichomoniasis, unspecified: Secondary | ICD-10-CM | POA: Diagnosis not present

## 2014-06-03 DIAGNOSIS — Z720 Tobacco use: Secondary | ICD-10-CM | POA: Insufficient documentation

## 2014-06-03 DIAGNOSIS — N72 Inflammatory disease of cervix uteri: Secondary | ICD-10-CM

## 2014-06-03 DIAGNOSIS — Z3202 Encounter for pregnancy test, result negative: Secondary | ICD-10-CM | POA: Diagnosis not present

## 2014-06-03 DIAGNOSIS — Z85828 Personal history of other malignant neoplasm of skin: Secondary | ICD-10-CM | POA: Diagnosis not present

## 2014-06-03 DIAGNOSIS — M1990 Unspecified osteoarthritis, unspecified site: Secondary | ICD-10-CM | POA: Diagnosis not present

## 2014-06-03 HISTORY — DX: Unspecified malignant neoplasm of skin, unspecified: C44.90

## 2014-06-03 LAB — WET PREP, GENITAL

## 2014-06-03 LAB — PREGNANCY, URINE: Preg Test, Ur: NEGATIVE

## 2014-06-03 MED ORDER — LIDOCAINE HCL (PF) 1 % IJ SOLN
INTRAMUSCULAR | Status: AC
  Administered 2014-06-03: 0.9 mL
  Filled 2014-06-03: qty 5

## 2014-06-03 MED ORDER — METRONIDAZOLE 500 MG PO TABS: 500.0000 mg | ORAL_TABLET | Freq: Two times a day (BID) | ORAL | Status: DC

## 2014-06-03 MED ORDER — AZITHROMYCIN 250 MG PO TABS
1000.0000 mg | ORAL_TABLET | Freq: Once | ORAL | Status: AC
  Administered 2014-06-03: 1000 mg via ORAL
  Filled 2014-06-03: qty 4

## 2014-06-03 MED ORDER — CEFTRIAXONE SODIUM 250 MG IJ SOLR
250.0000 mg | Freq: Once | INTRAMUSCULAR | Status: AC
  Administered 2014-06-03: 250 mg via INTRAMUSCULAR
  Filled 2014-06-03: qty 250

## 2014-06-03 NOTE — Discharge Instructions (Signed)
Cervicitis Cervicitis is a soreness and swelling (inflammation) of the cervix. Your cervix is located at the bottom of your uterus. It opens up to the vagina. CAUSES   Sexually transmitted infections (STIs).   Allergic reaction.   Medicines or birth control devices that are put in the vagina.   Injury to the cervix.   Bacterial infections.  RISK FACTORS You are at greater risk if you:  Have unprotected sexual intercourse.  Have sexual intercourse with many partners.  Began sexual intercourse at an early age.  Have a history of STIs. SYMPTOMS  There may be no symptoms. If symptoms occur, they may include:   Gray, white, yellow, or bad-smelling vaginal discharge.   Pain or itching of the area outside the vagina.   Painful sexual intercourse.   Lower abdominal or lower back pain, especially during intercourse.   Frequent urination.   Abnormal vaginal bleeding between periods, after sexual intercourse, or after menopause.   Pressure or a heavy feeling in the pelvis.  DIAGNOSIS  Diagnosis is made after a pelvic exam. Other tests may include:   Examination of any discharge under a microscope (wet prep).   A Pap test.  TREATMENT  Treatment will depend on the cause of cervicitis. If it is caused by an STI, both you and your partner will need to be treated. Antibiotic medicines will be given.  HOME CARE INSTRUCTIONS   Do not have sexual intercourse until your health care provider says it is okay.   Do not have sexual intercourse until your partner has been treated, if your cervicitis is caused by an STI.   Take your antibiotics as directed. Finish them even if you start to feel better.  SEEK MEDICAL CARE IF:  Your symptoms come back.   You have a fever.  MAKE SURE YOU:   Understand these instructions.  Will watch your condition.  Will get help right away if you are not doing well or get worse. Document Released: 05/19/2005 Document Revised:  05/24/2013 Document Reviewed: 11/10/2012 [facility] Patient Information 2015 [place], [place]. This information is not intended to replace advice given to you by your health care provider. Make sure you discuss any questions you have with your health care provider.    Chlamydia Chlamydia is an infection. It is spread through sexual contact. Chlamydia can be in different areas of the body. These areas include the cervix, urethra, throat, or rectum. You may not know you have chlamydia because many people never develop the symptoms. Chlamydia is not difficult to treat once you know you have it. However, if it is left untreated, chlamydia can lead to more serious health problems.  CAUSES  Chlamydia is caused by bacteria. It is a sexually transmitted disease. It is passed from an infected partner during intimate contact. This contact could be with the genitals, mouth, or rectal area. Chlamydia can also be passed from mothers to babies during birth. SIGNS AND SYMPTOMS  There may not be any symptoms. This is often the case early in the infection. If symptoms develop, they may include:  Mild pain and discomfort when urinating.  Redness, soreness, and swelling (inflammation) of the rectum.  Vaginal discharge.  Painful intercourse.  Abdominal pain.  Bleeding between menstrual periods. DIAGNOSIS  To diagnose this infection, your health care provider will do a pelvic exam. Cultures will be taken of the vagina, cervix, urine, and possibly the rectum to verify the diagnosis.  TREATMENT You will be given antibiotic medicines. If you are pregnant,  certain types of antibiotics will need to be avoided. Any sexual partners should also be treated, even if they do not show symptoms.  HOME CARE INSTRUCTIONS   Take your antibiotic medicine as directed by your health care provider. Finish the antibiotic even if you start to feel better.  Take medicines only as directed by your health care provider.  Inform any  sexual partners about the infection. They should also be treated.  Do not have sexual contact until your health care provider tells you it is okay.  Get plenty of rest.  Eat a well-balanced diet.  Drink enough fluids to keep your urine clear or pale yellow.  Keep all follow-up visits as directed by your health care provider. SEEK MEDICAL CARE IF:  You have painful urination.  You have abdominal pain.  You have vaginal discharge.  You have painful sexual intercourse.  You have bleeding between periods and after sex.  You have a fever. SEEK IMMEDIATE MEDICAL CARE IF:   You experience nausea or vomiting.  You experience excessive sweating (diaphoresis).  You have difficulty swallowing. MAKE SURE YOU:   Understand these instructions.  Will watch your condition.  Will get help right away if you are not doing well or get worse. Document Released: 02/26/2005 Document Revised: 10/03/2013 Document Reviewed: 01/24/2013 [facility] Patient Information 2015 [place], [place]. This information is not intended to replace advice given to you by your health care provider. Make sure you discuss any questions you have with your health care provider.

## 2014-06-03 NOTE — ED Notes (Signed)
Exposure to chlamydia- vaginal itching x 1 day

## 2014-06-03 NOTE — ED Provider Notes (Signed)
CSN: [id number]     Arrival date & time 06/03/14  4193 History   First MD Initiated Contact with Patient 06/03/14 [phone]     Chief Complaint  Patient presents with  . Exposure to STD     (Consider location/radiation/quality/duration/timing/severity/associated sxs/prior Treatment) HPI  44 year old female presents with 2 days of vaginal itching. Her ex-boyfriend whom she had intercourse with one week ago told her 3 days ago that he is being treated for chlamydia. The patient denies any vaginal discharge or bleeding. No dysuria or urinary frequency. No abdominal pain. There is no pain at all just the itching. Patient states she has been tested for HIV within the last 6 months and declines testing at this time. Denies any other symptoms such as fevers or back pain. Called PCP who told her to come to ER for testing/treatment.  Past Medical History  Diagnosis Date  . Arthritis   . Skin cancer    Past Surgical History  Procedure Laterality Date  . Cesarean section    . Tubal ligation    . Cholecystectomy    . Tubal ligation     No family history on file. History  Substance Use Topics  . Smoking status: Current Every Day Smoker -- 0.50 packs/day    Types: Cigarettes  . Smokeless tobacco: Never Used  . Alcohol Use: 1.2 oz/week    2 Not specified per week   OB History    No data available     Review of Systems  Constitutional: Negative for fever.  Gastrointestinal: Negative for abdominal pain.  Genitourinary: Negative for dysuria, frequency, vaginal bleeding and vaginal discharge.  All other systems reviewed and are negative.     Allergies  Tomato  Home Medications   Prior to Admission medications   Medication Sig Start Date End Date Taking? Authorizing Provider  hydrochlorothiazide (HYDRODIURIL) 25 MG tablet Take 25 mg by mouth daily.   Yes Historical Provider, MD  ibuprofen (ADVIL,MOTRIN) 200 MG tablet Take 400 mg by mouth every 6 (six) hours as needed. For toothache     Yes Historical Provider, MD  HYDROcodone-acetaminophen (NORCO/VICODIN) 5-325 MG per tablet Take 2 tablets by mouth every 4 (four) hours as needed. 06/07/13   Dot Lanes, MD  ibuprofen (ADVIL,MOTRIN) 600 MG tablet Take 1 tablet (600 mg total) by mouth every 8 (eight) hours. Take with food 01/22/13   Saddie Benders. Ghim, MD  ketorolac (ACULAR) 0.5 % ophthalmic solution Place 1 drop into the left eye 4 (four) times daily. 03/22/13   Larene Pickett, PA-C  oxyCODONE-acetaminophen (PERCOCET/ROXICET) 5-325 MG per tablet Take 1-2 tablets by mouth every 6 (six) hours as needed for pain. 04/06/12   Blanchie Dessert, MD  penicillin v potassium (VEETID) 500 MG tablet Take 500 mg by mouth 3 (three) times daily.      Historical Provider, MD  tobramycin (TOBREX) 0.3 % ophthalmic solution Place 1 drop into the left eye every 4 (four) hours. 03/20/13   Orlie Dakin, MD   BP 141/95 mmHg  Pulse 110  Temp(Src) 98.3 F (36.8 C) (Oral)  Resp 16  Ht 5\' 4"  (1.626 m)  Wt 161 lb (73.029 kg)  BMI 27.62 kg/m2  SpO2 99% Physical Exam  Constitutional: She is oriented to person, place, and time. She appears well-developed and well-nourished.  HENT:  Head: Normocephalic and atraumatic.  Right Ear: External ear normal.  Left Ear: External ear normal.  Nose: Nose normal.  Eyes: Right eye exhibits no discharge. Left eye exhibits  no discharge.  Cardiovascular: Normal rate, regular rhythm and normal heart sounds.   Pulmonary/Chest: Effort normal and breath sounds normal.  Abdominal: Soft. She exhibits no distension. There is no tenderness.  Genitourinary: Uterus is not enlarged and not tender. Cervix exhibits discharge (mild white) and friability. Cervix exhibits no motion tenderness.  Neurological: She is alert and oriented to person, place, and time.  Skin: Skin is warm and dry.  Nursing note and vitals reviewed.   ED Course  Procedures (including critical care time) Labs Review Labs Reviewed  WET PREP, GENITAL -  Abnormal; Notable for the following:    Yeast Wet Prep HPF POC FEW (*)    Trich, Wet Prep FEW (*)    Clue Cells Wet Prep HPF POC TOO NUMEROUS TO COUNT (*)    WBC, Wet Prep HPF POC FEW (*)    All other components within normal limits  GC/CHLAMYDIA PROBE AMP  PREGNANCY, URINE    Imaging Review No results found.   EKG Interpretation None      MDM   Final diagnoses:  Exposure to chlamydia  Cervicitis  Trichomoniasis    Patient with chlamydia exposure and likely cervicitis and vaginal itching. No urinary symptoms. Non tender uterus, no CMT. Not c/w PID. Rocephin/azithro. Trich and numerous clue cells seen, will cover for this with flagyl as well. Will treat for gc/chlamydia and recommend f/u with PCP.    Ephraim Hamburger, MD 06/03/14 1048

## 2014-06-03 NOTE — ED Notes (Signed)
MD at bedside. 

## 2014-06-03 NOTE — ED Notes (Signed)
Rx x 1 given for flagyl 

## 2014-06-07 LAB — GC/CHLAMYDIA PROBE AMP
CT PROBE, AMP APTIMA: NEGATIVE
GC PROBE AMP APTIMA: NEGATIVE

## 2015-05-01 ENCOUNTER — Emergency Department (HOSPITAL_BASED_OUTPATIENT_CLINIC_OR_DEPARTMENT_OTHER): Payer: 59

## 2015-05-01 ENCOUNTER — Emergency Department (HOSPITAL_BASED_OUTPATIENT_CLINIC_OR_DEPARTMENT_OTHER)
Admission: EM | Admit: 2015-05-01 | Discharge: 2015-05-01 | Disposition: A | Discharge status: Home / Self Care | Payer: 59 | Attending: Emergency Medicine | Admitting: Emergency Medicine

## 2015-05-01 ENCOUNTER — Encounter (HOSPITAL_BASED_OUTPATIENT_CLINIC_OR_DEPARTMENT_OTHER): Payer: Self-pay

## 2015-05-01 DIAGNOSIS — F17210 Nicotine dependence, cigarettes, uncomplicated: Secondary | ICD-10-CM | POA: Insufficient documentation

## 2015-05-01 DIAGNOSIS — M179 Osteoarthritis of knee, unspecified: Secondary | ICD-10-CM | POA: Insufficient documentation

## 2015-05-01 DIAGNOSIS — Z85828 Personal history of other malignant neoplasm of skin: Secondary | ICD-10-CM | POA: Insufficient documentation

## 2015-05-01 DIAGNOSIS — M1711 Unilateral primary osteoarthritis, right knee: Secondary | ICD-10-CM

## 2015-05-01 MED ORDER — TRAMADOL HCL 50 MG PO TABS: 50.0000 mg | ORAL_TABLET | Freq: Four times a day (QID) | ORAL | Status: DC | PRN

## 2015-05-01 NOTE — ED Notes (Signed)
C/o pain to right knee after hearing a pop-denies injury-limping but steady gait

## 2015-05-01 NOTE — ED Provider Notes (Signed)
CSN: [id number]     Arrival date & time 05/01/15  2002 History   First MD Initiated Contact with Patient 05/01/15 2016     Chief Complaint  Patient presents with  . Knee Pain     (Consider location/radiation/quality/duration/timing/severity/associated sxs/prior Treatment) Patient is a 44 y.o. female presenting with knee pain. The history is provided by the patient. No language interpreter was used.  Knee Pain Location:  Knee Injury: no   Knee location:  R knee Pain details:    Quality:  Aching   Radiates to:  Does not radiate   Severity:  Moderate   Onset quality:  Gradual   Timing:  Constant   Progression:  Unchanged Foreign body present:  No foreign bodies Relieved by:  Nothing   Past Medical History  Diagnosis Date  . Arthritis   . Skin cancer    Past Surgical History  Procedure Laterality Date  . Cesarean section    . Tubal ligation    . Cholecystectomy    . Tubal ligation     No family history on file. Social History  Substance Use Topics  . Smoking status: Current Every Day Smoker -- 0.50 packs/day    Types: Cigarettes  . Smokeless tobacco: Never Used  . Alcohol Use: Yes     Comment: occ   OB History    No data available     Review of Systems  All other systems reviewed and are negative.     Allergies  Tomato  Home Medications   Prior to Admission medications   Medication Sig Start Date End Date Taking? Authorizing Provider  ibuprofen (ADVIL,MOTRIN) 200 MG tablet Take 400 mg by mouth every 6 (six) hours as needed. For toothache     Historical Provider, MD  tobramycin (TOBREX) 0.3 % ophthalmic solution Place 1 drop into the left eye every 4 (four) hours. 03/20/13   Orlie Dakin, MD  traMADol (ULTRAM) 50 MG tablet Take 1 tablet (50 mg total) by mouth every 6 (six) hours as needed. 05/01/15   Glendell Docker, NP   BP 155/97 mmHg  Pulse 76  Temp(Src) 98.5 F (36.9 C) (Oral)  Resp 20  Ht 5' 3.5" (1.613 m)  Wt 77.14 kg  BMI 29.65 kg/m2   SpO2 100%  LMP 05/31/2013 Physical Exam  Constitutional: She appears well-developed and well-nourished.  Cardiovascular: Normal rate and regular rhythm.   Pulmonary/Chest: Effort normal and breath sounds normal.  Musculoskeletal:  Swelling noted to the right knee. Full rom. No redness or warmth  Neurological: She is alert. Coordination normal.  Skin: Skin is warm and dry.  Nursing note and vitals reviewed.   ED Course  Procedures (including critical care time) Labs Review Labs Reviewed - No data to display  Imaging Review Dg Knee Complete 4 Views Right  05/01/2015  CLINICAL DATA:  Right knee popping sensation.  Pain posteriorly. EXAM: RIGHT KNEE - COMPLETE 4+ VIEW COMPARISON:  11/15/2005 FINDINGS: No acute fracture or dislocation. No lytic or sclerotic osseous lesion. Mild medial femorotibial compartment joint space narrowing consistent with mild osteoarthritis. Mild osteoarthritis of the patellofemoral compartment. No significant joint effusion. 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 No acute osseous injury of the right knee. Electronically Signed   By: Kathreen Devoid   On: 05/01/2015 20:48   I have personally reviewed and evaluated these images and lab results as part of my medical decision-making.   EKG Interpretation None      MDM  Final diagnoses:  Arthritis of knee, right    Sleeve placed for comfort. Given ultram for pain and follow up with Dr. Jeanine Luz, NP 05/01/15 2100  Veryl Speak, MD 05/01/15 2257

## 2015-05-01 NOTE — Discharge Instructions (Signed)

## 2016-05-05 ENCOUNTER — Encounter (HOSPITAL_BASED_OUTPATIENT_CLINIC_OR_DEPARTMENT_OTHER): Payer: Self-pay | Admitting: Emergency Medicine

## 2016-05-05 ENCOUNTER — Emergency Department (HOSPITAL_BASED_OUTPATIENT_CLINIC_OR_DEPARTMENT_OTHER)
Admission: EM | Admit: 2016-05-05 | Discharge: 2016-05-05 | Disposition: A | Discharge status: Home / Self Care | Payer: Self-pay | Attending: Emergency Medicine | Admitting: Emergency Medicine

## 2016-05-05 DIAGNOSIS — Z23 Encounter for immunization: Secondary | ICD-10-CM | POA: Insufficient documentation

## 2016-05-05 DIAGNOSIS — L723 Sebaceous cyst: Secondary | ICD-10-CM | POA: Insufficient documentation

## 2016-05-05 DIAGNOSIS — Z85828 Personal history of other malignant neoplasm of skin: Secondary | ICD-10-CM | POA: Insufficient documentation

## 2016-05-05 DIAGNOSIS — L089 Local infection of the skin and subcutaneous tissue, unspecified: Secondary | ICD-10-CM

## 2016-05-05 DIAGNOSIS — L02411 Cutaneous abscess of right axilla: Secondary | ICD-10-CM | POA: Insufficient documentation

## 2016-05-05 DIAGNOSIS — F17210 Nicotine dependence, cigarettes, uncomplicated: Secondary | ICD-10-CM | POA: Insufficient documentation

## 2016-05-05 MED ORDER — TETANUS-DIPHTH-ACELL PERTUSSIS 5-2.5-18.5 LF-MCG/0.5 IM SUSP
0.5000 mL | Freq: Once | INTRAMUSCULAR | Status: AC
  Administered 2016-05-05: 0.5 mL via INTRAMUSCULAR
  Filled 2016-05-05: qty 0.5

## 2016-05-05 MED ORDER — LIDOCAINE-EPINEPHRINE (PF) 2 %-1:200000 IJ SOLN
10.0000 mL | Freq: Once | INTRAMUSCULAR | Status: AC
  Administered 2016-05-05: 10 mL via INTRADERMAL

## 2016-05-05 MED ORDER — LIDOCAINE-EPINEPHRINE (PF) 2 %-1:200000 IJ SOLN
INTRAMUSCULAR | Status: AC
  Filled 2016-05-05: qty 20

## 2016-05-05 MED ORDER — TRAMADOL HCL 50 MG PO TABS: 50.0000 mg | ORAL_TABLET | Freq: Four times a day (QID) | ORAL | 0 refills | Status: DC | PRN

## 2016-05-05 MED FILL — traMADol HCL 50 MG TABS: 50 | 2 days supply | Qty: 8 | Fill #0

## 2016-05-05 NOTE — ED Notes (Signed)
ED Provider at bedside. 

## 2016-05-05 NOTE — Discharge Instructions (Signed)
You have an infected sebaceous cyst that was incised and drained.  Apply warm compress to affected area several times daily.  Remove packing in 2 days.  Follow up with your doctor or return here if you notice worsening pain or swelling or if you have other concerns.

## 2016-05-05 NOTE — ED Provider Notes (Signed)
[name] Provider Note   CSN: [id number] Arrival date & time: 05/05/16  1524  By signing my name below, I, Doran Stabler, attest that this documentation has been prepared under the direction and in the presence of Domenic Moras, PA-C. Electronically Signed: Doran Stabler, ED Scribe. 05/05/16. 5:01 PM.  History   Chief Complaint Chief Complaint  Patient presents with  . Abscess   The history is provided by the patient. No language interpreter was used.   HPI Comments: Ashley Hart is a 45 y.o. female who presents to the Emergency Department with a PMHx of skin cancer complaining of an right axillary abscess that has been worsening for the past 1 week. Pt states she had a mammogram of her right axilla 2 years ago and was told that she has a sebaceous cyst. It was small in size up until last week. Since then, it has been growing in pain and size. Pt has pain when touched her cyst is touched. Pt takes ibuprofen for pain with mild relief. Pt denies fevers, CP, SOB, N/V/D or any other symptoms at this time.   Past Medical History:  Diagnosis Date  . Arthritis   . Skin cancer    There are no active problems to display for this patient.  Past Surgical History:  Procedure Laterality Date  . CESAREAN SECTION    . CHOLECYSTECTOMY    . TUBAL LIGATION    . TUBAL LIGATION      OB History    No data available     Home Medications    Prior to Admission medications   Medication Sig Start Date End Date Taking? Authorizing Provider  ibuprofen (ADVIL,MOTRIN) 200 MG tablet Take 400 mg by mouth every 6 (six) hours as needed. For toothache     Historical Provider, MD  tobramycin (TOBREX) 0.3 % ophthalmic solution Place 1 drop into the left eye every 4 (four) hours. 03/20/13   Orlie Dakin, MD  traMADol (ULTRAM) 50 MG tablet Take 1 tablet (50 mg total) by mouth every 6 (six) hours as needed. 05/01/15   Glendell Docker, NP    Family History History reviewed. No pertinent  family history.  Social History Social History  Substance Use Topics  . Smoking status: Current Every Day Smoker    Packs/day: 0.50    Types: Cigarettes  . Smokeless tobacco: Never Used  . Alcohol use Yes     Comment: occ    Allergies   Tomato  Review of Systems Review of Systems  Constitutional: Negative for chills and fever.  Respiratory: Negative for shortness of breath.   Cardiovascular: Negative for chest pain.  Gastrointestinal: Negative for diarrhea, nausea and vomiting.  Skin:       Axillary abscess   Physical Exam Updated Vital Signs BP 120/89 (BP Location: Left Arm)   Pulse 70   Temp 98.1 F (36.7 C) (Oral)   Resp 16   Ht 5\' 4"  (1.626 m)   Wt 180 lb (81.6 kg)   LMP 05/31/2013   SpO2 100%   BMI 30.90 kg/m   Physical Exam  Constitutional: She is oriented to person, place, and time. She appears well-developed and well-nourished.  HENT:  Head: Normocephalic.  Eyes: Conjunctivae are normal.  Cardiovascular: Normal rate.   Pulmonary/Chest: Effort normal.  Neurological: She is alert and oriented to person, place, and time.  Skin: Skin is dry.  2 cm subcutaneous nodule over the right axillary fold, tender to palpation. No skin erythema or warmth.  Psychiatric: She has a normal mood and affect.  Nursing note and vitals reviewed.  ED Treatments / Results  DIAGNOSTIC STUDIES: Oxygen Saturation is 100% on room air, normal by my interpretation.    COORDINATION OF CARE: 5:01 PM Discussed treatment plan with pt at bedside and pt agreed to plan.  Labs (all labs ordered are listed, but only abnormal results are displayed) Labs Reviewed - No data to display  EKG  EKG Interpretation None      Radiology No results found.  Procedures Procedures (including critical care time)  INCISION AND DRAINAGE Performed by: Domenic Moras Consent: Verbal consent obtained. Risks and benefits: risks, benefits and alternatives were discussed Type: abscess  Body  area: R axillary fold  Anesthesia: local infiltration  Incision was made with a scalpel.  Local anesthetic: lidocaine 2% w epinephrine  Anesthetic total: 3 ml  Complexity: complex Blunt dissection to break up loculations  Drainage: purulent  Drainage amount: moderate  Packing material: 1/4 in iodoform gauze  Patient tolerance: Patient tolerated the procedure well with no immediate complications.     Medications Ordered in ED Medications - No data to display  Initial Impression / Assessment and Plan / ED Course  I have reviewed the triage vital signs and the nursing notes.  Pertinent labs & imaging results that were available during my care of the patient were reviewed by me and considered in my medical decision making (see chart for details).  Clinical Course   Patient with skin abscess amenable to incision and drainage.  Packing placed. Encouraged home warm soaks and flushing.  Mild signs of cellulitis is surrounding skin.  Will d/c to home.  No antibiotic therapy is indicated. Packing remove in 2 days, return if worsen.  BP 120/89 (BP Location: Left Arm)   Pulse 70   Temp 98.1 F (36.7 C) (Oral)   Resp 16   Ht 5\' 4"  (1.626 m)   Wt 81.6 kg   LMP 05/31/2013   SpO2 100%   BMI 30.90 kg/m    Final Clinical Impressions(s) / ED Diagnoses   Final diagnoses:  Infected sebaceous cyst   New Prescriptions Current Discharge Medication List     I personally performed the services described in this documentation, which was scribed in my presence. The recorded information has been reviewed and is accurate.       Domenic Moras, PA-C 05/05/16 [place], MD 05/06/16 [phone]

## 2016-05-05 NOTE — ED Triage Notes (Signed)
Patient states that she has a sebacouse cyst to her right axilla region. Has had it x 2 years, about 1 week ago it started to get irritated and hurting

## 2016-05-05 NOTE — ED Notes (Signed)
Pt teaching provided on medications that may cause drowsiness. Pt instructed not to drive or operate heavy machinery while taking the prescribed medication. Pt verbalized understanding.   

## 2016-06-23 ENCOUNTER — Emergency Department (HOSPITAL_BASED_OUTPATIENT_CLINIC_OR_DEPARTMENT_OTHER)
Admission: EM | Admit: 2016-06-23 | Discharge: 2016-06-24 | Disposition: A | Discharge status: Home / Self Care | Payer: BLUE CROSS/BLUE SHIELD | Attending: Emergency Medicine | Admitting: Emergency Medicine

## 2016-06-23 ENCOUNTER — Encounter (HOSPITAL_BASED_OUTPATIENT_CLINIC_OR_DEPARTMENT_OTHER): Payer: Self-pay

## 2016-06-23 ENCOUNTER — Emergency Department (HOSPITAL_BASED_OUTPATIENT_CLINIC_OR_DEPARTMENT_OTHER): Payer: BLUE CROSS/BLUE SHIELD

## 2016-06-23 DIAGNOSIS — M7121 Synovial cyst of popliteal space [Baker], right knee: Secondary | ICD-10-CM | POA: Diagnosis not present

## 2016-06-23 DIAGNOSIS — Z85828 Personal history of other malignant neoplasm of skin: Secondary | ICD-10-CM | POA: Insufficient documentation

## 2016-06-23 DIAGNOSIS — F17210 Nicotine dependence, cigarettes, uncomplicated: Secondary | ICD-10-CM | POA: Diagnosis not present

## 2016-06-23 DIAGNOSIS — M25561 Pain in right knee: Secondary | ICD-10-CM | POA: Diagnosis present

## 2016-06-23 DIAGNOSIS — M7122 Synovial cyst of popliteal space [Baker], left knee: Secondary | ICD-10-CM

## 2016-06-23 MED ORDER — KETOROLAC TROMETHAMINE 60 MG/2ML IM SOLN
60.0000 mg | Freq: Once | INTRAMUSCULAR | Status: AC
  Administered 2016-06-23: 60 mg via INTRAMUSCULAR
  Filled 2016-06-23: qty 2

## 2016-06-23 MED ORDER — NAPROXEN 375 MG PO TABS
375.0000 mg | ORAL_TABLET | Freq: Two times a day (BID) | ORAL | 0 refills | Status: DC
Start: 2016-06-23 — End: 2020-07-14

## 2016-06-23 NOTE — ED Triage Notes (Signed)
Woke yesterday with pain,swelling to right LE-denies injury-NAD-slow gait

## 2016-06-23 NOTE — ED Notes (Signed)
c/o R posterior knee pain and swelling, reports h/o arthritis in both knees, no h/o knee surgery, onset yesterday, "walks alot for work", (denies: fever, n/t, heat, redness or bruising), compartments soft, ibuprofen PTA with some temporary minimal relief until up on feet.

## 2016-06-23 NOTE — ED Provider Notes (Signed)
[facility] Provider Note   CSN: [id number] Arrival date & time: 06/23/16  [id number]  By signing my name below, I, Ephriam Jenkins, attest that this documentation has been prepared under the direction and in the presence of Blanchie Dessert, MD. Electronically signed, Ephriam Jenkins, ED Scribe. 06/23/16. 10:19 PM.  History   Chief Complaint Chief Complaint  Patient presents with  . Leg Swelling    HPI HPI Comments: Ashley Hart is a 46 y.o. female, with Hx of arthritis, who presents to the Emergency Department complaining of worsening right knee pain and swelling that started yesterday. Pt states that the pain worsened today while at work. Pt states "it feels like something is tugging behind my knee". She reports that she walks a lot for work. Pt reports exacerbation of pain during ambulation. She has taken ibuprofen with no relief. No pain to ankle or foot. No loss of sensation. She does not have a sports medicine doctor.  The history is provided by the patient. No language interpreter was used.    Past Medical History:  Diagnosis Date  . Arthritis   . Skin cancer     There are no active problems to display for this patient.   Past Surgical History:  Procedure Laterality Date  . CESAREAN SECTION    . CHOLECYSTECTOMY    . TUBAL LIGATION    . TUBAL LIGATION      OB History    No data available       Home Medications    Prior to Admission medications   Not on File    Family History No family history on file.  Social History Social History  Substance Use Topics  . Smoking status: Current Every Day Smoker    Packs/day: 0.50    Types: Cigarettes  . Smokeless tobacco: Never Used  . Alcohol use Yes     Comment: occ    Allergies   Tomato   Review of Systems Review of Systems  Constitutional: Negative for fever.  Musculoskeletal: Positive for arthralgias (right knee), gait problem (due to pain) and joint swelling (right knee).  All other systems  reviewed and are negative.    Physical Exam Updated Vital Signs BP 145/100 (BP Location: Right Arm)   Pulse 83   Temp 98.2 F (36.8 C) (Oral)   Resp 18   Ht 5\' 4"  (1.626 m)   Wt 183 lb (83 kg)   LMP 05/31/2013   SpO2 100%   BMI 31.41 kg/m   Physical Exam  Constitutional: She is oriented to person, place, and time. She appears well-developed and well-nourished. No distress.  HENT:  Head: Normocephalic and atraumatic.  Neck: Normal range of motion.  Pulmonary/Chest: Effort normal.  Musculoskeletal:  Right knee with effusion, tenderness in the popliteal fossa with small mass present that is mobile. No erythema. Pt is able to flex the knee to 90 degrees. 2+ dp pulse. No LE edema or calf pain.  Neurological: She is alert and oriented to person, place, and time.  Skin: Skin is warm and dry. She is not diaphoretic.  Psychiatric: She has a normal mood and affect. Judgment normal.  Nursing note and vitals reviewed.    ED Treatments / Results  DIAGNOSTIC STUDIES: Oxygen Saturation is 100% on RA, normal by my interpretation.  COORDINATION OF CARE: 10:15 PM-Discussed treatment plan with pt at bedside and pt agreed to plan.   Labs (all labs ordered are listed, but only abnormal results are displayed) Labs Reviewed -  No data to display  EKG  EKG Interpretation None       Radiology US Venous Img Lower Unilateral Right  Result Date: 06/23/2016 CLINICAL DATA:  Acute onset of right calf pain and swelling. Initial encounter. EXAM: RIGHT LOWER EXTREMITY VENOUS DOPPLER ULTRASOUND TECHNIQUE: Gray-scale sonography with graded compression, as well as color Doppler and duplex ultrasound were performed to evaluate the lower extremity deep venous systems from the level of the common femoral vein and including the common femoral, femoral, profunda femoral, popliteal and calf veins including the posterior tibial, peroneal and gastrocnemius veins when visible. The superficial great saphenous  vein was also interrogated. Spectral Doppler was utilized to evaluate flow at rest and with distal augmentation maneuvers in the common femoral, femoral and popliteal veins. COMPARISON:  None. FINDINGS: Contralateral Common Femoral Vein: Respiratory phasicity is normal and symmetric with the symptomatic side. No evidence of thrombus. Normal compressibility. Common Femoral Vein: No evidence of thrombus. Normal compressibility, respiratory phasicity and response to augmentation. Saphenofemoral Junction: No evidence of thrombus. Normal compressibility and flow on color Doppler imaging. Profunda Femoral Vein: No evidence of thrombus. Normal compressibility and flow on color Doppler imaging. Femoral Vein: No evidence of thrombus. Normal compressibility, respiratory phasicity and response to augmentation. Popliteal Vein: No evidence of thrombus. Normal compressibility, respiratory phasicity and response to augmentation. Calf Veins: No evidence of thrombus. Normal compressibility and flow on color Doppler imaging. Superficial Great Saphenous Vein: No evidence of thrombus. Normal compressibility and flow on color Doppler imaging. Venous Reflux:  None. Other Findings: Note is made of a right-sided Zachar's cyst at the popliteal fossa, measuring 4.3 x 2.3 x 1.8 cm, possibly containing minimal debris. IMPRESSION: 1. No evidence of deep venous thrombosis. 2. 4.3 cm Muraoka's cyst noted at the right popliteal fossa. Electronically Signed   By: Garald Balding M.D.   On: 06/23/2016 21:25    Procedures Procedures (including critical care time)  Medications Ordered in ED Medications - No data to display   Initial Impression / Assessment and Plan / ED Course  I have reviewed the triage vital signs and the nursing notes.  Pertinent labs & imaging results that were available during my care of the patient were reviewed by me and considered in my medical decision making (see chart for details).     Patient presenting with  complaint of worsening right knee pain and lump in the back of her knee. No calf pain swelling or tenderness. On exam patient has knee effusion and palpable lesion in the popliteal fossa. Venous ultrasound shows no evidence of DVT before 0.3 cm Henkin cyst. Also patient has known arthritis to this knee. Cysts seems to be the area that is most tender. No evidence of infection or septic arthritis. Patient placed in a knee sleeve given anti-inflammatories and encouraged to follow-up  Final Clinical Impressions(s) / ED Diagnoses   Final diagnoses:  Labra cyst, left    New Prescriptions New Prescriptions   NAPROXEN (NAPROSYN) 375 MG TABLET    Take 1 tablet (375 mg total) by mouth 2 (two) times daily.   I personally performed the services described in this documentation, which was scribed in my presence.  The recorded information has been reviewed and considered.     Blanchie Dessert, MD 06/23/16 [phone]

## 2017-02-01 ENCOUNTER — Encounter (HOSPITAL_BASED_OUTPATIENT_CLINIC_OR_DEPARTMENT_OTHER): Payer: Self-pay

## 2017-02-01 ENCOUNTER — Emergency Department (HOSPITAL_BASED_OUTPATIENT_CLINIC_OR_DEPARTMENT_OTHER)
Admission: EM | Admit: 2017-02-01 | Discharge: 2017-02-01 | Disposition: A | Discharge status: Home / Self Care | Payer: BLUE CROSS/BLUE SHIELD | Attending: Emergency Medicine | Admitting: Emergency Medicine

## 2017-02-01 ENCOUNTER — Emergency Department (HOSPITAL_BASED_OUTPATIENT_CLINIC_OR_DEPARTMENT_OTHER): Payer: BLUE CROSS/BLUE SHIELD

## 2017-02-01 DIAGNOSIS — F17210 Nicotine dependence, cigarettes, uncomplicated: Secondary | ICD-10-CM | POA: Diagnosis not present

## 2017-02-01 DIAGNOSIS — M25552 Pain in left hip: Secondary | ICD-10-CM

## 2017-02-01 MED ORDER — NAPROXEN 250 MG PO TABS
500.0000 mg | ORAL_TABLET | Freq: Once | ORAL | Status: AC
  Administered 2017-02-01: 500 mg via ORAL
  Filled 2017-02-01: qty 2

## 2017-02-01 NOTE — ED Notes (Signed)
Pt to XR via stretcher.

## 2017-02-01 NOTE — ED Provider Notes (Signed)
[facility] Provider Note   CSN: [id number] Arrival date & time: 02/01/17  0725     History   Chief Complaint Chief Complaint  Patient presents with  . Hip Pain    HPI Ashley Hart is a 46 y.o. female.  The history is provided by the patient.  Hip Pain  This is a new problem. The current episode started more than 1 week ago. The problem occurs constantly. The problem has not changed since onset.Pertinent negatives include no abdominal pain. The symptoms are aggravated by walking. Nothing relieves the symptoms. Treatments tried: aleve. The treatment provided mild relief.   46 year old female with history of arthritis who presents with left hip pain persistent after mechanical fall. About 1 week ago with stooping on the ground when she lost her balance and fell backward onto her buttocks. States that she began to have left hip pain, for which she has been taking ibuprofen and using warm compresses without improvement. States that she is on her feet a lot at work. Denies any numbness or weakness in the legs. Denies any other injuries.  Past Medical History:  Diagnosis Date  . Arthritis   . Skin cancer     There are no active problems to display for this patient.   Past Surgical History:  Procedure Laterality Date  . CESAREAN SECTION    . CHOLECYSTECTOMY    . TUBAL LIGATION    . TUBAL LIGATION      OB History    No data available       Home Medications    Prior to Admission medications   Medication Sig Start Date End Date Taking? Authorizing Provider  naproxen (NAPROSYN) 375 MG tablet Take 1 tablet (375 mg total) by mouth 2 (two) times daily. 06/23/16   Blanchie Dessert, MD    Family History No family history on file.  Social History Social History  Substance Use Topics  . Smoking status: Current Every Day Smoker    Packs/day: 0.50    Types: Cigarettes  . Smokeless tobacco: Never Used  . Alcohol use Yes     Comment: occ     Allergies     Tomato   Review of Systems Review of Systems  Constitutional: Negative for fever.  Gastrointestinal: Negative for abdominal pain.  Musculoskeletal: Positive for arthralgias (left hip pain).  Skin: Negative for wound.  Neurological: Negative for weakness and numbness.  Hematological: Does not bruise/bleed easily.     Physical Exam Updated Vital Signs BP (!) 170/95 (BP Location: Left Arm)   Pulse 77   Temp 98.2 F (36.8 C) (Oral)   Resp 16   Ht 5\' 3"  (1.6 m)   Wt 83 kg (183 lb)   LMP 05/31/2013   SpO2 99%   BMI 32.42 kg/m   Physical Exam Physical Exam  Constitutional: Appears well-developed and well-nourished. No acute distress. HENT:  Head: Normocephalic.  Eyes: Conjunctivae are normal.  Neck: Supple Cardiovascular: Normal rate and intact distal pulses.   Pulmonary/Chest: Effort normal. No respiratory distress.  Abdominal: Exhibits no distension.  Musculoskeletal: Normal range of motion of left hip. Exhibits no deformity. Pain with palpation of posterior hip Neurological: Alert. Fluent speech. Sensation to light touch in tact in bilateral lower extremities. Full strength in bilateral lower extremities. Normal gait Skin: Skin is warm and dry.  Psychiatric: Normal mood and affect. Behavior is normal.  Nursing note and vitals reviewed.   ED Treatments / Results  Labs (all labs ordered are listed,  but only abnormal results are displayed) Labs Reviewed - No data to display  EKG  EKG Interpretation None       Radiology Dg Hip Unilat W Or Wo Pelvis 2-3 Views Left  Result Date: 02/01/2017 CLINICAL DATA:  46 year old with persistent left hip pain after a fall 1 week ago. Initial encounter. EXAM: DG HIP (WITH OR WITHOUT PELVIS) 2-3V LEFT COMPARISON:  None. FINDINGS: No evidence of acute or subacute fracture or dislocation. Mild inferomedial joint space narrowing with likely subchondral cysts in the acetabulum. Bone mineral density well-preserved. Included AP pelvis  demonstrates similar mild inferomedial joint space narrowing in the contralateral right hip. Sacroiliac joints and symphysis pubis intact with mild degenerative changes involving the lower right SI joint. Visualized lower lumbar spine intact. IMPRESSION: 1. No acute or subacute osseous abnormality. 2. Mild osteoarthritis is suspected. Electronically Signed   By: Evangeline Dakin M.D.   On: 02/01/2017 08:05    Procedures Procedures (including critical care time)  Medications Ordered in ED Medications  naproxen (NAPROSYN) tablet 500 mg (500 mg Oral Given 02/01/17 0741)     Initial Impression / Assessment and Plan / ED Course  I have reviewed the triage vital signs and the nursing notes.  Pertinent labs & imaging results that were available during my care of the patient were reviewed by me and considered in my medical decision making (see chart for details).     Suspect persistent soft tissue pain/bruising after mechanical fall. Ambulatory in ED. Extremity NV in tact. X-ray visualized. No fracture. Mild arthritis. Patient to continue supportive care management including anti-inflammatory medications and heat packs. Strict return and follow-up instructions reviewed. She expressed understanding of all discharge instructions and felt comfortable with the plan of care.   Final Clinical Impressions(s) / ED Diagnoses   Final diagnoses:  Left hip pain    New Prescriptions New Prescriptions   No medications on file     Forde Dandy, MD 02/01/17 [phone]

## 2017-02-01 NOTE — Discharge Instructions (Signed)
Your x-ray shows a small amount of arthritis in the left hip. There are no broken bones or injury. This is likely bruising and muscle soreness from fall Continue anti-inflammatory pain medications and heat packs Return for worsening symptoms, including inability to walk, new numbness/weakness, fever, or any other symptoms concerning to you

## 2017-02-01 NOTE — ED Triage Notes (Signed)
Pt reports fall last Saturday. COntinued left hip pain despite heat and ibuprofen at home.

## 2017-02-01 NOTE — ED Notes (Signed)
ED Provider at bedside. 

## 2017-07-06 ENCOUNTER — Ambulatory Visit: Payer: BLUE CROSS/BLUE SHIELD | Admitting: Medical

## 2017-10-05 ENCOUNTER — Other Ambulatory Visit: Payer: Self-pay

## 2017-10-05 ENCOUNTER — Emergency Department (HOSPITAL_BASED_OUTPATIENT_CLINIC_OR_DEPARTMENT_OTHER)
Admission: EM | Admit: 2017-10-05 | Discharge: 2017-10-05 | Disposition: A | Discharge status: Home / Self Care | Payer: 59 | Attending: Emergency Medicine | Admitting: Emergency Medicine

## 2017-10-05 ENCOUNTER — Emergency Department (HOSPITAL_BASED_OUTPATIENT_CLINIC_OR_DEPARTMENT_OTHER): Payer: 59

## 2017-10-05 ENCOUNTER — Encounter (HOSPITAL_BASED_OUTPATIENT_CLINIC_OR_DEPARTMENT_OTHER): Payer: Self-pay | Admitting: Emergency Medicine

## 2017-10-05 DIAGNOSIS — Y939 Activity, unspecified: Secondary | ICD-10-CM | POA: Insufficient documentation

## 2017-10-05 DIAGNOSIS — W108XXA Fall (on) (from) other stairs and steps, initial encounter: Secondary | ICD-10-CM | POA: Diagnosis not present

## 2017-10-05 DIAGNOSIS — Z85828 Personal history of other malignant neoplasm of skin: Secondary | ICD-10-CM | POA: Diagnosis not present

## 2017-10-05 DIAGNOSIS — Y929 Unspecified place or not applicable: Secondary | ICD-10-CM | POA: Insufficient documentation

## 2017-10-05 DIAGNOSIS — F17210 Nicotine dependence, cigarettes, uncomplicated: Secondary | ICD-10-CM | POA: Diagnosis not present

## 2017-10-05 DIAGNOSIS — Y999 Unspecified external cause status: Secondary | ICD-10-CM | POA: Insufficient documentation

## 2017-10-05 DIAGNOSIS — S62346A Nondisplaced fracture of base of fifth metacarpal bone, right hand, initial encounter for closed fracture: Secondary | ICD-10-CM

## 2017-10-05 DIAGNOSIS — S6981XA Other specified injuries of right wrist, hand and finger(s), initial encounter: Secondary | ICD-10-CM | POA: Diagnosis present

## 2017-10-05 MED ORDER — OXYCODONE-ACETAMINOPHEN 5-325 MG PO TABS: 1.0000 | ORAL_TABLET | Freq: Once | ORAL | Status: DC

## 2017-10-05 MED ORDER — OXYCODONE-ACETAMINOPHEN 5-325 MG PO TABS: 1.0000 | ORAL_TABLET | Freq: Three times a day (TID) | ORAL | 0 refills | Status: DC | PRN

## 2017-10-05 MED FILL — OXYCODONE-ACETAMINOPHEN 5-3: 5-325 | 3 days supply | Qty: 10 | Fill #0

## 2017-10-05 NOTE — ED Provider Notes (Signed)
Emergency Department Provider Note   I have reviewed the triage vital signs and the nursing notes.   HISTORY  Chief Complaint Hand Pain   HPI Ashley Hart is a 47 y.o. female who had a mechanical fall up the stairs yesterday landing on her right hand outstretched.  She presents here with swelling and pain to her right medial wrist.  She went to work yesterday and every time she used to seem to be worse and this morning she woke up it was still there.  Not improved with over-the-counter medications.  Also complains of some thumb pain on the same hand.  Did not hit or hurt anything else.  Did not syncopized just missed the step. No other associated or modifying symptoms.    Past Medical History:  Diagnosis Date  . Arthritis   . Skin cancer     There are no active problems to display for this patient.   Past Surgical History:  Procedure Laterality Date  . CESAREAN SECTION    . CHOLECYSTECTOMY    . TUBAL LIGATION    . TUBAL LIGATION      Current Outpatient Rx  . Order #: [id number] Class: Print  . Order #: [id number] Class: Print    Allergies Tomato  History reviewed. No pertinent family history.  Social History Social History   Tobacco Use  . Smoking status: Current Every Day Smoker    Packs/day: 0.50    Types: Cigarettes  . Smokeless tobacco: Never Used  Substance Use Topics  . Alcohol use: Yes    Comment: occ  . Drug use: No    Review of Systems  All other systems negative except as documented in the HPI. All pertinent positives and negatives as reviewed in the HPI. ____________________________________________   PHYSICAL EXAM:  VITAL SIGNS: ED Triage Vitals  Enc Vitals Group     BP 10/05/17 0717 129/90     Pulse Rate 10/05/17 0717 73     Resp 10/05/17 0717 18     Temp 10/05/17 0717 97.7 F (36.5 C)     Temp Source 10/05/17 0717 Oral     SpO2 10/05/17 0717 98 %     Weight 10/05/17 0729 180 lb (81.6 kg)     Height 10/05/17 0729 5\' 4"  (1.626 m)      Head Circumference --      Peak Flow --      Pain Score 10/05/17 0729 9     Pain Loc --      Pain Edu? --      Excl. in [place]? --     Constitutional: Alert and oriented. Well appearing and in no acute distress. Eyes: Conjunctivae are normal. PERRL. EOMI. Head: Atraumatic. Nose: No congestion/rhinnorhea. Mouth/Throat: Mucous membranes are moist.  Oropharynx non-erythematous. Neck: No stridor.  No meningeal signs.   Cardiovascular: Normal rate, regular rhythm. Good peripheral circulation. Grossly normal heart sounds.   Respiratory: Normal respiratory effort.  No retractions. Lungs CTAB. Gastrointestinal: Soft and nontender. No distention.  Musculoskeletal: No lower extremity tenderness nor edema. No gross deformities of extremities. ttp at base of fifth MCP and over thumb. No deformity, mild swellling. Cold to touch (had icepack in place) but normal cap refill.  Neurologic:  Normal speech and language. No gross focal neurologic deficits are appreciated.  Skin:  Skin is warm, dry and intact. No rash noted.   ____________________________________________   RADIOLOGY  Dg Hand Complete Right  Result Date: 10/05/2017 CLINICAL DATA:  Recent fall with swelling and  pain in the right hand EXAM: RIGHT HAND - COMPLETE 3+ VIEW COMPARISON:  None. FINDINGS: The radiocarpal joint space appears normal and the ulnar styloid is intact. The carpal bones are normal position. No definite acute fracture is seen. A faint lucency is noted in the base of the fifth metacarpal most likely a normal small notch, but if pain persists follow-up is recommended. MCP, PIP, and DIP joints are unremarkable. No erosions are seen. IMPRESSION: 1. No definite acute fracture. 2. Vague lucency at the base of the fifth metacarpal most likely normal but consider follow-up imaging if pain persists. Electronically Signed   By: Ivar Drape M.D.   On: 10/05/2017 08:03    ____________________________________________   INITIAL  IMPRESSION / ASSESSMENT AND PLAN / ED COURSE  Suspect mcp fracture. Splint/pcp follow up in a week.    Pertinent labs & imaging results that were available during my care of the patient were reviewed by me and considered in my medical decision making (see chart for details).  ____________________________________________  FINAL CLINICAL IMPRESSION(S) / ED DIAGNOSES  Final diagnoses:  Closed nondisplaced fracture of base of fifth metacarpal bone of right hand, initial encounter     MEDICATIONS GIVEN DURING THIS VISIT:  Medications  oxyCODONE-acetaminophen (PERCOCET/ROXICET) 5-325 MG per tablet 1 tablet (has no administration in time range)     NEW OUTPATIENT MEDICATIONS STARTED DURING THIS VISIT:  New Prescriptions   OXYCODONE-ACETAMINOPHEN (PERCOCET) 5-325 MG TABLET    Take 1 tablet by mouth every 8 (eight) hours as needed for severe pain.    Note:  This note was prepared with assistance of Dragon voice recognition software. Occasional wrong-word or sound-a-like substitutions may have occurred due to the inherent limitations of voice recognition software.   Breccan Galant, Corene Cornea, MD 10/05/17 [phone]

## 2017-10-05 NOTE — ED Triage Notes (Signed)
Patient states that slipped and fell yesterday up her steps, patient states that she is having pain and has noted swelling to her right hand

## 2017-10-19 ENCOUNTER — Other Ambulatory Visit (HOSPITAL_BASED_OUTPATIENT_CLINIC_OR_DEPARTMENT_OTHER): Payer: Self-pay | Admitting: Physician Assistant

## 2017-10-19 DIAGNOSIS — Z1231 Encounter for screening mammogram for malignant neoplasm of breast: Secondary | ICD-10-CM

## 2017-10-20 ENCOUNTER — Ambulatory Visit (HOSPITAL_BASED_OUTPATIENT_CLINIC_OR_DEPARTMENT_OTHER)
Admission: RE | Admit: 2017-10-20 | Discharge: 2017-10-20 | Disposition: A | Discharge status: Home / Self Care | Payer: 59 | Source: Ambulatory Visit | Attending: Physician Assistant | Admitting: Physician Assistant

## 2017-10-20 ENCOUNTER — Encounter (HOSPITAL_BASED_OUTPATIENT_CLINIC_OR_DEPARTMENT_OTHER): Payer: Self-pay

## 2017-10-20 DIAGNOSIS — Z1231 Encounter for screening mammogram for malignant neoplasm of breast: Secondary | ICD-10-CM

## 2017-10-26 ENCOUNTER — Emergency Department (HOSPITAL_BASED_OUTPATIENT_CLINIC_OR_DEPARTMENT_OTHER)
Admission: EM | Admit: 2017-10-26 | Discharge: 2017-10-26 | Disposition: A | Discharge status: Home / Self Care | Payer: 59 | Attending: Emergency Medicine | Admitting: Emergency Medicine

## 2017-10-26 ENCOUNTER — Other Ambulatory Visit: Payer: Self-pay

## 2017-10-26 ENCOUNTER — Encounter (HOSPITAL_BASED_OUTPATIENT_CLINIC_OR_DEPARTMENT_OTHER): Payer: Self-pay

## 2017-10-26 DIAGNOSIS — Z79899 Other long term (current) drug therapy: Secondary | ICD-10-CM | POA: Insufficient documentation

## 2017-10-26 DIAGNOSIS — L728 Other follicular cysts of the skin and subcutaneous tissue: Secondary | ICD-10-CM | POA: Diagnosis not present

## 2017-10-26 DIAGNOSIS — L729 Follicular cyst of the skin and subcutaneous tissue, unspecified: Secondary | ICD-10-CM

## 2017-10-26 DIAGNOSIS — L089 Local infection of the skin and subcutaneous tissue, unspecified: Secondary | ICD-10-CM

## 2017-10-26 DIAGNOSIS — F17210 Nicotine dependence, cigarettes, uncomplicated: Secondary | ICD-10-CM | POA: Insufficient documentation

## 2017-10-26 MED ORDER — LIDOCAINE HCL (PF) 1 % IJ SOLN
2.0000 mL | Freq: Once | INTRAMUSCULAR | Status: AC
  Administered 2017-10-26: 2 mL
  Filled 2017-10-26: qty 5

## 2017-10-26 MED ORDER — IBUPROFEN 800 MG PO TABS
800.0000 mg | ORAL_TABLET | Freq: Once | ORAL | Status: AC
  Administered 2017-10-26: 800 mg via ORAL
  Filled 2017-10-26: qty 1

## 2017-10-26 MED ORDER — CEPHALEXIN 500 MG PO CAPS: 1000.0000 mg | ORAL_CAPSULE | Freq: Two times a day (BID) | ORAL | 0 refills | Status: DC

## 2017-10-26 MED ORDER — IBUPROFEN 800 MG PO TABS: 800.0000 mg | ORAL_TABLET | Freq: Three times a day (TID) | ORAL | 0 refills | Status: AC

## 2017-10-26 MED ORDER — CEPHALEXIN 250 MG PO CAPS
1000.0000 mg | ORAL_CAPSULE | Freq: Once | ORAL | Status: AC
  Administered 2017-10-26: 1000 mg via ORAL
  Filled 2017-10-26: qty 4

## 2017-10-26 NOTE — Discharge Instructions (Signed)
1.  Take your antibiotics as prescribed. 2.  Leave dressing in place for 24 hours.  Then begin rinsing your wound well with warm water, pat dry with clean paper towel and reapply a dressing. 3.  You should have a recheck within the next 2 days.  4.  Return to the emergency department if you have increasing redness develop fever or symptoms of infection.

## 2017-10-26 NOTE — ED Notes (Signed)
ED Provider at bedside. 

## 2017-10-26 NOTE — ED Triage Notes (Signed)
Pt c/o cyst to chest x "months" worse x 3 days-NAD-steady gait

## 2017-10-26 NOTE — ED Provider Notes (Signed)
[facility] EMERGENCY DEPARTMENT Provider Note   CSN: [id number] Arrival date & time: 10/26/17  2041     History   Chief Complaint Chief Complaint  Patient presents with  . Cyst    HPI Ashley Hart is a 47 y.o. female.  HPI She reports she has had a cyst on her central chest for months.  Over the past couple days though it started to get red and very tender.  She reports this started after she got a mammogram.  No fever no chills no general illness.  Reports she has had a couple other cysts in the past that had to be drained. Past Medical History:  Diagnosis Date  . Arthritis   . Skin cancer     There are no active problems to display for this patient.   Past Surgical History:  Procedure Laterality Date  . CESAREAN SECTION    . CHOLECYSTECTOMY    . TUBAL LIGATION    . TUBAL LIGATION       OB History   None      Home Medications    Prior to Admission medications   Medication Sig Start Date End Date Taking? Authorizing Provider  cephALEXin (KEFLEX) 500 MG capsule Take 2 capsules (1,000 mg total) by mouth 2 (two) times daily. 10/26/17   Charlesetta Shanks, MD  ibuprofen (ADVIL,MOTRIN) 800 MG tablet Take 1 tablet (800 mg total) by mouth 3 (three) times daily. 10/26/17   Charlesetta Shanks, MD  naproxen (NAPROSYN) 375 MG tablet Take 1 tablet (375 mg total) by mouth 2 (two) times daily. 06/23/16   Blanchie Dessert, MD  oxyCODONE-acetaminophen (PERCOCET) 5-325 MG tablet Take 1 tablet by mouth every 8 (eight) hours as needed for severe pain. 10/05/17   Mesner, Corene Cornea, MD    Family History No family history on file.  Social History Social History   Tobacco Use  . Smoking status: Current Every Day Smoker    Packs/day: 0.50    Types: Cigarettes  . Smokeless tobacco: Never Used  Substance Use Topics  . Alcohol use: Yes    Comment: occ  . Drug use: No     Allergies   Tomato   Review of Systems Review of Systems Constitutional: No fever no chills no  malaise Respiratory: No chest pain no shortness of breath no cough  Physical Exam Updated Vital Signs BP 116/80 (BP Location: Left Arm)   Pulse 76   Temp 98.3 F (36.8 C) (Oral)   Resp 18   Ht 5\' 4"  (1.626 m)   Wt 77.1 kg (170 lb)   LMP 05/31/2013   SpO2 100%   BMI 29.18 kg/m   Physical Exam  Constitutional: She appears well-developed and well-nourished. No distress.  Eyes: EOM are normal.  Pulmonary/Chest: Effort normal.  Chest wall over sternum and approximately fourth ribs has a 2 cm nodule that is slightly raised and erythematous over the top.  It has well-circumscribed margins and is mobile beneath the skin.  Consistent with a cyst with secondary infection.  Neurological: She is alert. She exhibits normal muscle tone. Coordination normal.  Skin: Skin is warm and dry.  Psychiatric: She has a normal mood and affect.     ED Treatments / Results  Labs (all labs ordered are listed, but only abnormal results are displayed) Labs Reviewed - No data to display  EKG None  Radiology No results found.  Procedures .Marland KitchenIncision and Drainage Date/Time: 10/26/2017 11:36 PM Performed by: Charlesetta Shanks, MD Authorized by: Johnney Killian,  Jeannie Done, MD   Consent:    Consent obtained:  Verbal   Consent given by:  Patient   Risks discussed:  Bleeding, incomplete drainage, pain and infection   Alternatives discussed:  No treatment and delayed treatment Location:    Type:  Cyst   Size:  2.5   Location:  Trunk   Trunk location:  Chest Pre-procedure details:    Skin preparation:  Betadine Anesthesia (see MAR for exact dosages):    Anesthesia method:  Local infiltration   Local anesthetic:  Lidocaine 1% w/o epi Procedure type:    Complexity:  Complex Procedure details:    Incision types:  Single straight   Incision depth:  Subcutaneous   Scalpel blade:  11   Wound management:  Probed and deloculated   Drainage:  Purulent   Drainage amount:  Copious   Wound treatment:  Wound left  open   Packing materials:  None Post-procedure details:    Patient tolerance of procedure:  Tolerated well, no immediate complications Comments:     Purulent material shot out of the incision initially.  Also caseous and brownish clotted material removed by expressing.   (including critical care time)  Medications Ordered in ED Medications  lidocaine (PF) (XYLOCAINE) 1 % injection 2 mL (2 mLs Infiltration Given 10/26/17 2305)  cephALEXin (KEFLEX) capsule 1,000 mg (1,000 mg Oral Given 10/26/17 2305)  ibuprofen (ADVIL,MOTRIN) tablet 800 mg (800 mg Oral Given 10/26/17 2305)     Initial Impression / Assessment and Plan / ED Course  I have reviewed the triage vital signs and the nursing notes.  Pertinent labs & imaging results that were available during my care of the patient were reviewed by me and considered in my medical decision making (see chart for details).     Final Clinical Impressions(s) / ED Diagnoses   Final diagnoses:  Infected cyst of skin   Patient had a cyst in the anterior chest wall.  Had become secondarily infected over the past several days.  Managed by incision and drainage.  Patient be placed on Keflex.  She is otherwise well without signs of secondary infection.  Return precautions reviewed. ED Discharge Orders        Ordered    cephALEXin (KEFLEX) 500 MG capsule  2 times daily     10/26/17 2329    ibuprofen (ADVIL,MOTRIN) 800 MG tablet  3 times daily     10/26/17 2329       Charlesetta Shanks, MD 10/26/17 2339

## 2017-10-26 NOTE — ED Notes (Signed)
Lido at bedside

## 2017-10-27 MED FILL — IBUPROFEN 800 MG TAB: 800 | 7 days supply | Qty: 21 | Fill #0

## 2017-10-27 MED FILL — CEPHALEXIN 500 MG CAPSULE: 500 | 7 days supply | Qty: 28 | Fill #0

## 2019-04-20 ENCOUNTER — Encounter (HOSPITAL_BASED_OUTPATIENT_CLINIC_OR_DEPARTMENT_OTHER): Payer: Self-pay

## 2019-04-20 ENCOUNTER — Other Ambulatory Visit: Payer: Self-pay

## 2019-04-20 ENCOUNTER — Emergency Department (HOSPITAL_BASED_OUTPATIENT_CLINIC_OR_DEPARTMENT_OTHER): Payer: 59

## 2019-04-20 ENCOUNTER — Emergency Department (HOSPITAL_BASED_OUTPATIENT_CLINIC_OR_DEPARTMENT_OTHER)
Admission: EM | Admit: 2019-04-20 | Discharge: 2019-04-20 | Disposition: A | Discharge status: Home / Self Care | Payer: 59 | Attending: Emergency Medicine | Admitting: Emergency Medicine

## 2019-04-20 DIAGNOSIS — I10 Essential (primary) hypertension: Secondary | ICD-10-CM | POA: Insufficient documentation

## 2019-04-20 DIAGNOSIS — F17210 Nicotine dependence, cigarettes, uncomplicated: Secondary | ICD-10-CM | POA: Insufficient documentation

## 2019-04-20 DIAGNOSIS — M7121 Synovial cyst of popliteal space [Baker], right knee: Secondary | ICD-10-CM | POA: Diagnosis not present

## 2019-04-20 DIAGNOSIS — M25561 Pain in right knee: Secondary | ICD-10-CM | POA: Diagnosis present

## 2019-04-20 DIAGNOSIS — R2241 Localized swelling, mass and lump, right lower limb: Secondary | ICD-10-CM | POA: Insufficient documentation

## 2019-04-20 DIAGNOSIS — Z91018 Allergy to other foods: Secondary | ICD-10-CM | POA: Insufficient documentation

## 2019-04-20 DIAGNOSIS — Z85828 Personal history of other malignant neoplasm of skin: Secondary | ICD-10-CM | POA: Insufficient documentation

## 2019-04-20 MED ORDER — NAPROXEN 500 MG PO TABS: 500.0000 mg | ORAL_TABLET | Freq: Two times a day (BID) | ORAL | 0 refills | Status: AC

## 2019-04-20 NOTE — ED Notes (Signed)
Pt reports taking ibuprofen around 1 pm today

## 2019-04-20 NOTE — Discharge Instructions (Signed)
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 Please take naproxen as needed for your pain. You may also rest, ice, and elevate your leg for comfort. Please follow up with orthopedics/sports medicine. It appears you saw Dr. Casimer Leek in the past. You may also see Dr. Raeford Razor with sports medicine.

## 2019-04-20 NOTE — ED Provider Notes (Signed)
[facility] EMERGENCY DEPARTMENT Provider Note   CSN: [id number] Arrival date & time: 04/20/19  1509     History   Chief Complaint Chief Complaint  Patient presents with  . Knee Pain    HPI Ashley Hart is a 48 y.o. female with PMH significant for arthritis and HTN who presents to the ED with a 2-day history of progressively worsening popliteal region pain and swelling.  She denies any trauma or injury to the area as well as any increased physical activity.  She admits to arthritic changes in both knees, but reports that this feels much different.  She denies any recent immobilization, recent surgery, history of clots, shortness of breath, chest pain, pleuritic chest discomfort, bony discomfort, numbness, or tingling.  She reports that she is able to ambulate, albeit with discomfort.  She reports that the pain is worse with knee extension.  No obvious alleviating factors.     HPI  Past Medical History:  Diagnosis Date  . Arthritis   . Skin cancer     There are no active problems to display for this patient.   Past Surgical History:  Procedure Laterality Date  . CESAREAN SECTION    . CHOLECYSTECTOMY    . TUBAL LIGATION    . TUBAL LIGATION       OB History   No obstetric history on file.      Home Medications    Prior to Admission medications   Medication Sig Start Date End Date Taking? Authorizing Provider  hydrochlorothiazide (HYDRODIURIL) 25 MG tablet Take by mouth. 01/27/19  Yes [provider]  cephALEXin (KEFLEX) 500 MG capsule Take 2 capsules (1,000 mg total) by mouth 2 (two) times daily. 10/26/17   Charlesetta Shanks, MD  Cholecalciferol (VITAMIN D3) 125 MCG (5000 UT) TABS Take by mouth.    [provider]  ibuprofen (ADVIL,MOTRIN) 800 MG tablet Take 1 tablet (800 mg total) by mouth 3 (three) times daily. 10/26/17   Charlesetta Shanks, MD  naproxen (NAPROSYN) 375 MG tablet Take 1 tablet (375 mg total) by mouth 2 (two) times daily.  06/23/16   Blanchie Dessert, MD  oxyCODONE-acetaminophen (PERCOCET) 5-325 MG tablet Take 1 tablet by mouth every 8 (eight) hours as needed for severe pain. 10/05/17   Mesner, Corene Cornea, MD    Family History History reviewed. No pertinent family history.  Social History Social History   Tobacco Use  . Smoking status: Current Every Day Smoker    Packs/day: 0.50    Types: Cigarettes  . Smokeless tobacco: Never Used  Substance Use Topics  . Alcohol use: Yes    Comment: occ  . Drug use: No     Allergies   Tomato   Review of Systems Review of Systems  Constitutional: Negative for chills and fever.  Respiratory: Negative for chest tightness and shortness of breath.   Cardiovascular: Negative for chest pain.  Musculoskeletal: Positive for arthralgias and joint swelling.     Physical Exam Updated Vital Signs BP 133/87 (BP Location: Right Arm)   Pulse 82   Temp 98.5 F (36.9 C) (Oral)   Resp 16   Ht 5\' 5"  (1.651 m)   Wt 83 kg   LMP 05/31/2013   SpO2 98%   BMI 30.45 kg/m   Physical Exam Vitals signs and nursing note reviewed. Exam conducted with a chaperone present.  Constitutional:      Appearance: Normal appearance.  HENT:     Head: Normocephalic and atraumatic.  Eyes:  General: No scleral icterus.    Conjunctiva/sclera: Conjunctivae normal.  Neck:     Musculoskeletal: Normal range of motion and neck supple.  Cardiovascular:     Rate and Rhythm: Normal rate and regular rhythm.     Pulses: Normal pulses.     Heart sounds: Normal heart sounds.  Pulmonary:     Effort: Pulmonary effort is normal.     Breath sounds: Normal breath sounds.  Abdominal:     General: Abdomen is flat.     Palpations: Abdomen is soft.  Musculoskeletal:     Comments: Right knee: No bony TTP.  Popliteal region TTP, most notably over distal hamstring.  Mild swelling when compared to left knee.  No erythema or warmth.  No wound or overlying skin changes.  ROM and strength intact, albeit  limited by pain.  No significant calf tenderness. Right ankle: ROM and strength normal, distal sensation and pulses intact.  Skin:    General: Skin is dry.     Capillary Refill: Capillary refill takes less than 2 seconds.     Comments: No erythema.  Neurological:     General: No focal deficit present.     Mental Status: She is alert and oriented to person, place, and time.     GCS: GCS eye subscore is 4. GCS verbal subscore is 5. GCS motor subscore is 6.     Sensory: No sensory deficit.     Comments: Antalgic gait.  Psychiatric:        Mood and Affect: Mood normal.        Behavior: Behavior normal.        Thought Content: Thought content normal.      ED Treatments / Results  Labs (all labs ordered are listed, but only abnormal results are displayed) Labs Reviewed - No data to display  EKG None  Radiology No results found.  Procedures Procedures (including critical care time)  Medications Ordered in ED Medications - No data to display   Initial Impression / Assessment and Plan / ED Course  I have reviewed the triage vital signs and the nursing notes.  Pertinent labs & imaging results that were available during my care of the patient were reviewed by me and considered in my medical decision making (see chart for details).        Given the unimpressive joint swelling, full ROM, and lack of erythema or warmth, I have low suspicion for septic arthritis or gout.  Her symptoms are atraumatic and she denies any increased physical activity or mechanical etiology for her progressively worsening popliteal discomfort.  For this reason, did not feel as though plain films are warranted to assess for dislocation, fracture, or other bony abnormalities.  I have personally reviewed her records and she has a documented history of arthritis.   Instead, will obtain DVT ultrasound study to evaluate for potential clot.  She is relatively low risk as she has never been told that she has a  clotting disorder, had a clot, recent immobilization or surgery, OCPs, malignancy, or other obvious clotting risks.  She does however endorse tobacco use.   If DVT study is negative, suspect that her popliteal discomfort is musculoskeletal in nature.  Possibly distal hamstring spasms vs. Bachtell's cyst.  Will prescribe short course of muscle relaxants and encouraged her to continue using her ibuprofen, as needed for inflammation and discomfort.  At shift change care was transferred to [facility], PA-C who will follow pending studies, re-evaluate, and determine disposition.  Final Clinical Impressions(s) / ED Diagnoses   Final diagnoses:  None    ED Discharge Orders    None       Corena Herter, PA-C 04/20/19 1811    Fredia Sorrow, MD 04/24/19 [phone]

## 2019-04-20 NOTE — ED Provider Notes (Signed)
Care assumed from Ashley Blue, PA-C, at shift change, please see their notes for full documentation of patient's complaint/HPI. Briefly, pt here with atraumatic right knee pain in popliteal area.  Awaiting DVT study at this time. Plan is to discharge accordingly. 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   Physical Exam  BP 133/87 (BP Location: Right Arm)   Pulse 82   Temp 98.5 F (36.9 C) (Oral)   Resp 16   Ht 5\' 5"  (1.651 m)   Wt 83 kg   LMP 05/31/2013   SpO2 98%   BMI 30.45 kg/m   Physical Exam Vitals signs and nursing note reviewed.  Constitutional:      Appearance: She is not ill-appearing.  HENT:     Head: Normocephalic and atraumatic.  Eyes:     Conjunctiva/sclera: Conjunctivae normal.  Cardiovascular:     Rate and Rhythm: Normal rate and regular rhythm.  Pulmonary:     Effort: Pulmonary effort is normal.     Breath sounds: Normal breath sounds.  Musculoskeletal:     Comments: No obvious swelling to right knee compared to left; no increased warmth or erythema. ROM limited with extension due to pain although intact passively. Able to flex without difficulty. Strength 5/5 bilaterally. Sensation intact throughout. 2+ DP and PT pulses.   Skin:    General: Skin is warm and dry.     Coloration: Skin is not jaundiced.  Neurological:     Mental Status: She is alert.     ED Course/Procedures     Procedures  MDM  Ultrasound positive for Ferrante's cyst; compared to previous ultrasound study just mildly bigger. 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  When updating patient on ultrasound she reports that this is exactly what it feels like, she did not think about her previous White's cyst.  Prescribe naproxen.  Will advise her to follow-up with orthopedics.  It appears that she has seen Dr. Casimer Leek in the past.  Also given name of sports medicine physician upstairs for her to follow-up with.   Patient advised to rest, ice, elevate the area as well for comfort.  She is in agreement with plan at this time and stable for discharge home.        Eustaquio Maize, PA-C 04/20/19 1909    Fredia Sorrow, MD 04/24/19 [phone]

## 2019-04-20 NOTE — ED Triage Notes (Signed)
Pt c/o pain in right posterior knee with some swelling. Denies recent injury.

## 2020-07-14 ENCOUNTER — Emergency Department (HOSPITAL_BASED_OUTPATIENT_CLINIC_OR_DEPARTMENT_OTHER)
Admission: EM | Admit: 2020-07-14 | Discharge: 2020-07-14 | Disposition: A | Discharge status: Home / Self Care | Payer: 59 | Attending: Emergency Medicine | Admitting: Emergency Medicine

## 2020-07-14 ENCOUNTER — Encounter (HOSPITAL_BASED_OUTPATIENT_CLINIC_OR_DEPARTMENT_OTHER): Payer: Self-pay | Admitting: Emergency Medicine

## 2020-07-14 ENCOUNTER — Other Ambulatory Visit: Payer: Self-pay

## 2020-07-14 DIAGNOSIS — Z79899 Other long term (current) drug therapy: Secondary | ICD-10-CM | POA: Insufficient documentation

## 2020-07-14 DIAGNOSIS — I10 Essential (primary) hypertension: Secondary | ICD-10-CM | POA: Diagnosis not present

## 2020-07-14 DIAGNOSIS — Z859 Personal history of malignant neoplasm, unspecified: Secondary | ICD-10-CM | POA: Diagnosis not present

## 2020-07-14 DIAGNOSIS — F17210 Nicotine dependence, cigarettes, uncomplicated: Secondary | ICD-10-CM | POA: Diagnosis not present

## 2020-07-14 DIAGNOSIS — N390 Urinary tract infection, site not specified: Secondary | ICD-10-CM | POA: Diagnosis not present

## 2020-07-14 DIAGNOSIS — R350 Frequency of micturition: Secondary | ICD-10-CM | POA: Diagnosis present

## 2020-07-14 HISTORY — DX: Malignant (primary) neoplasm, unspecified: C80.1

## 2020-07-14 HISTORY — DX: Essential (primary) hypertension: I10

## 2020-07-14 LAB — URINALYSIS, ROUTINE W REFLEX MICROSCOPIC
Bilirubin Urine: NEGATIVE
Glucose, UA: NEGATIVE mg/dL
Ketones, ur: NEGATIVE mg/dL
Nitrite: POSITIVE — AB
Protein, ur: 30 mg/dL — AB
Specific Gravity, Urine: 1.01 (ref 1.005–1.030)
pH: 6.5 (ref 5.0–8.0)

## 2020-07-14 LAB — URINALYSIS, MICROSCOPIC (REFLEX)

## 2020-07-14 MED ORDER — PHENAZOPYRIDINE HCL 200 MG PO TABS: 200.0000 mg | ORAL_TABLET | Freq: Three times a day (TID) | ORAL | 0 refills | Status: AC

## 2020-07-14 MED ORDER — CEPHALEXIN 500 MG PO CAPS
500.0000 mg | ORAL_CAPSULE | Freq: Three times a day (TID) | ORAL | 0 refills | Status: AC
Start: 2020-07-14 — End: 2020-07-21

## 2020-07-14 NOTE — ED Provider Notes (Signed)
[facility] Provider Note   CSN: [id number] Arrival date & time: 07/14/20  0750     History Chief Complaint  Patient presents with  . Dysuria    Decreased output with frequent urination    Ashley Hart is a 50 y.o. female.  HPI      2 days of urinary frequency, hesitancy, urgency Some bladder spasm sensation lower abdomen that comes and goes with urination, no other abdominal pain, no back pain/flank p0ain/n/v/fever  No diarrhea or constipation. No vaginal discharge or bleeding  Past Medical History:  Diagnosis Date  . Cancer ([place])   . Hypertension     There are no problems to display for this patient.   Past Surgical History:  Procedure Laterality Date  . CESAREAN SECTION    . CHOLECYSTECTOMY    . TUBAL LIGATION    . TUBAL LIGATION       OB History   No obstetric history on file.     History reviewed. No pertinent family history.  Social History   Tobacco Use  . Smoking status: Current Every Day Smoker    Packs/day: 0.50    Types: Cigarettes  . Smokeless tobacco: Never Used  Substance Use Topics  . Drug use: No    Home Medications Prior to Admission medications   Medication Sig Start Date End Date Taking? Authorizing Provider  cephALEXin (KEFLEX) 500 MG capsule Take 1 capsule (500 mg total) by mouth 3 (three) times daily for 7 days. 07/14/20 07/21/20 Yes Gareth Morgan, MD  Cholecalciferol (VITAMIN D3) 125 MCG (5000 UT) TABS Take by mouth.   Yes [provider]  hydrochlorothiazide (HYDRODIURIL) 25 MG tablet Take by mouth. 01/27/19  Yes [provider]  phenazopyridine (PYRIDIUM) 200 MG tablet Take 1 tablet (200 mg total) by mouth 3 (three) times daily. 07/14/20  Yes Gareth Morgan, MD  ibuprofen (ADVIL,MOTRIN) 800 MG tablet Take 1 tablet (800 mg total) by mouth 3 (three) times daily. 10/26/17   Charlesetta Shanks, MD  naproxen (NAPROSYN) 500 MG tablet Take 1 tablet (500 mg total) by mouth 2 (two)  times daily. 04/20/19   Eustaquio Maize, PA-C    Allergies    Tomato  Review of Systems   Review of Systems  Constitutional: Negative for fever.  Respiratory: Negative for shortness of breath.   Cardiovascular: Negative for chest pain.  Gastrointestinal: Negative for abdominal pain, nausea and vomiting.  Genitourinary: Positive for dysuria. Negative for difficulty urinating, flank pain, vaginal bleeding and vaginal discharge.  Musculoskeletal: Negative for back pain and neck pain.  Skin: Negative for rash.  Neurological: Negative for syncope.    Physical Exam Updated Vital Signs BP (!) 137/91   Pulse 75   Temp 98.1 F (36.7 C) (Oral)   Resp 12   Ht 5\' 4"  (1.626 m)   Wt 78.5 kg   LMP 05/31/2013   SpO2 100%   BMI 29.70 kg/m   Physical Exam Vitals and nursing note reviewed.  Constitutional:      General: She is not in acute distress.    Appearance: Normal appearance. She is not ill-appearing, toxic-appearing or diaphoretic.  HENT:     Head: Normocephalic.  Eyes:     Conjunctiva/sclera: Conjunctivae normal.  Cardiovascular:     Rate and Rhythm: Normal rate and regular rhythm.     Pulses: Normal pulses.  Pulmonary:     Effort: Pulmonary effort is normal. No respiratory distress.  Abdominal:     General: There is no  distension.     Palpations: Abdomen is soft.     Tenderness: There is no abdominal tenderness. There is no right CVA tenderness or left CVA tenderness.  Musculoskeletal:        General: No deformity or signs of injury.     Cervical back: No rigidity.  Skin:    General: Skin is warm and dry.     Coloration: Skin is not jaundiced or pale.  Neurological:     General: No focal deficit present.     Mental Status: She is alert and oriented to person, place, and time.     ED Results / Procedures / Treatments   Labs (all labs ordered are listed, but only abnormal results are displayed) Labs Reviewed  URINALYSIS, ROUTINE W REFLEX MICROSCOPIC - Abnormal;  Notable for the following components:      Result Value   APPearance CLOUDY (*)    Hgb urine dipstick LARGE (*)    Protein, ur 30 (*)    Nitrite POSITIVE (*)    Leukocytes,Ua MODERATE (*)    All other components within normal limits  URINALYSIS, MICROSCOPIC (REFLEX) - Abnormal; Notable for the following components:   Bacteria, UA MANY (*)    All other components within normal limits    EKG None  Radiology No results found.  Procedures Procedures   Medications Ordered in ED Medications - No data to display  ED Course  I have reviewed the triage vital signs and the nursing notes.  Pertinent labs & imaging results that were available during my care of the patient were reviewed by me and considered in my medical decision making (see chart for details).    MDM Rules/Calculators/A&P                          50yo female presents with concern for urinary symptoms.  No significant abdominal pain or tenderness, doubt appendicitis, diverticulitis or other acute intraabdominal process. Post menopausal.  Exam, history and UA consistent with urinary tract infection.  Given rx for keflex and pyridium. Patient discharged in stable condition with understanding of reasons to return.     Final Clinical Impression(s) / ED Diagnoses Final diagnoses:  Lower urinary tract infectious disease    Rx / DC Orders ED Discharge Orders         Ordered    cephALEXin (KEFLEX) 500 MG capsule  3 times daily        07/14/20 0923    phenazopyridine (PYRIDIUM) 200 MG tablet  3 times daily        07/14/20 4403           Gareth Morgan, MD 07/14/20 2123

## 2020-07-14 NOTE — ED Notes (Signed)
ED Provider at bedside. 

## 2020-07-14 NOTE — ED Triage Notes (Signed)
Pt states pain with urination at end of urine stream for past 2 days.  Denies nausea/vomiting.  Denies fever

## 2020-08-22 IMAGING — US US EXTREM LOW VENOUS*R*
1 series · 13 of 24 positions shown · non-contrast
Comparison: 06/23/2016

CLINICAL DATA: 48-year-old with right popliteal pain.



[Series 1: us extrem low venous*right* · 13 of 37 slices shown]
[im 1/37]
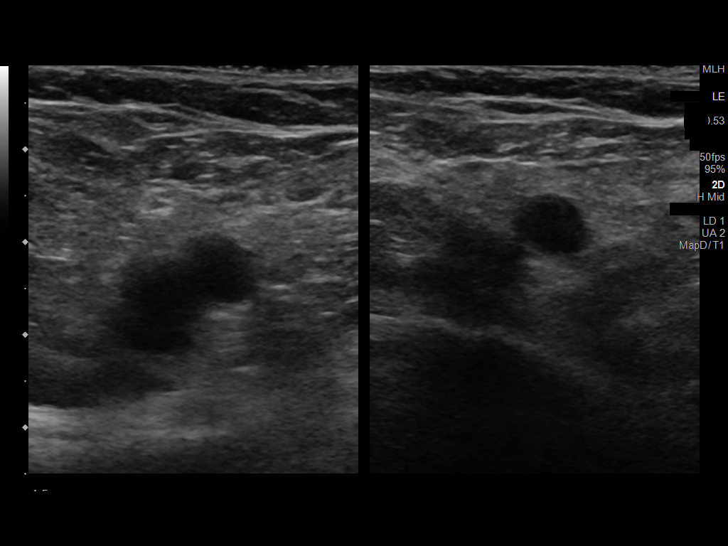
[im 4/37]
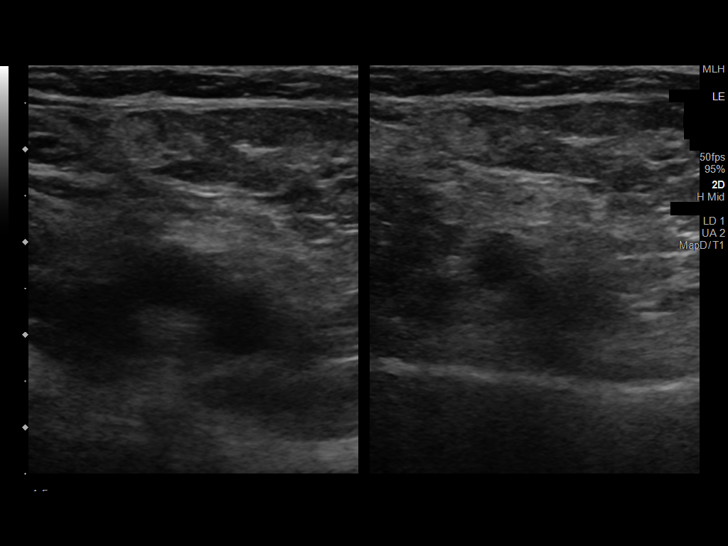
[im 7/37]
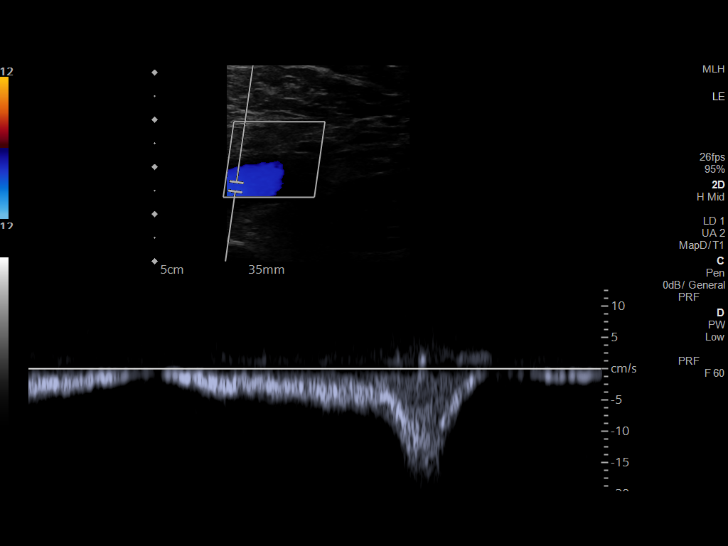
[im 10/37]
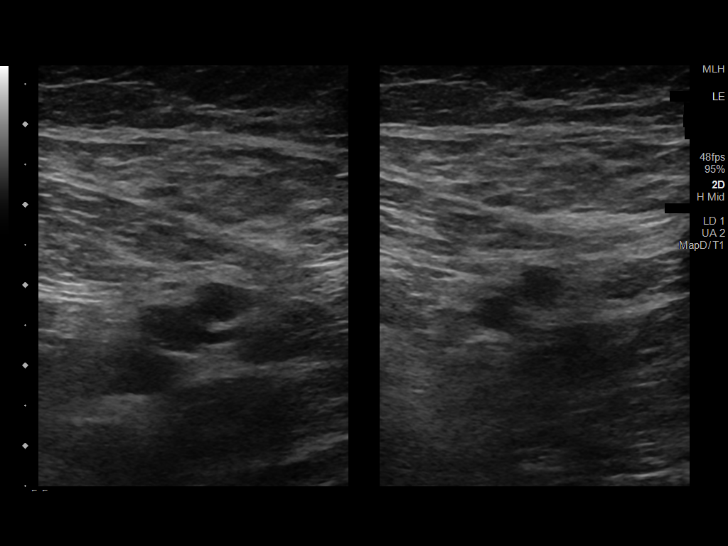
[im 13/37]
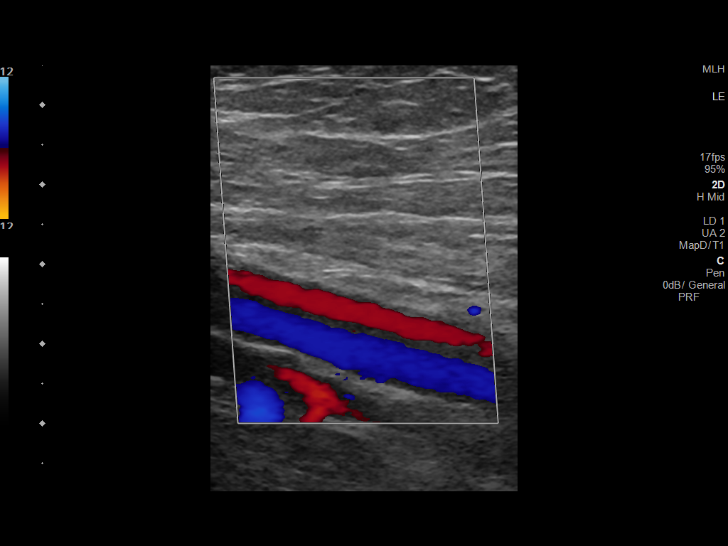
[im 16/37]
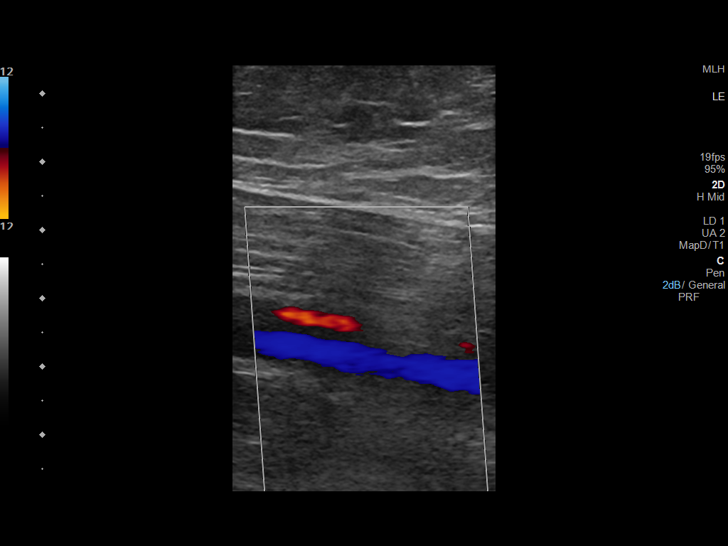
[im 19/37]
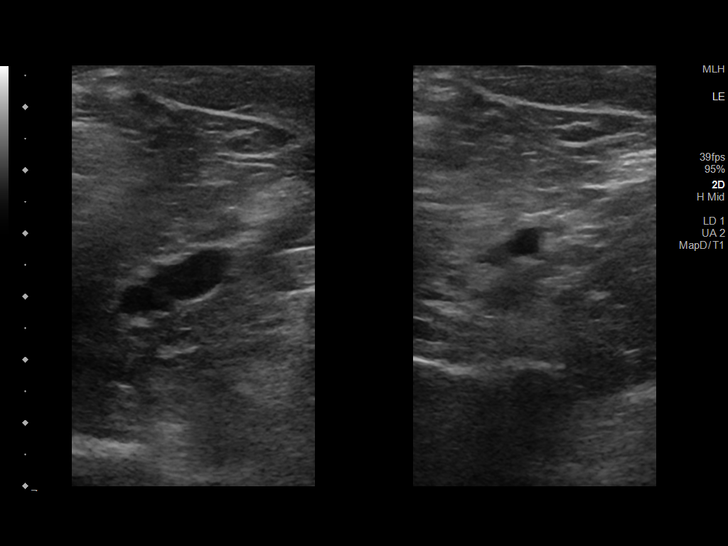
[im 21/37]
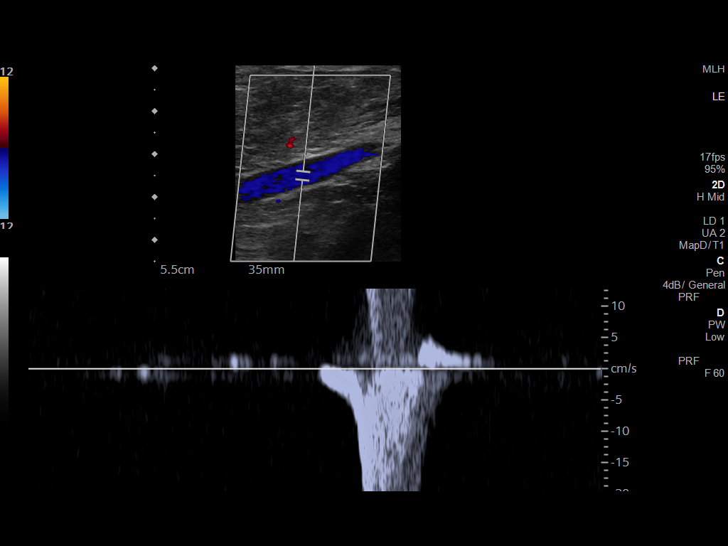
[im 24/37]
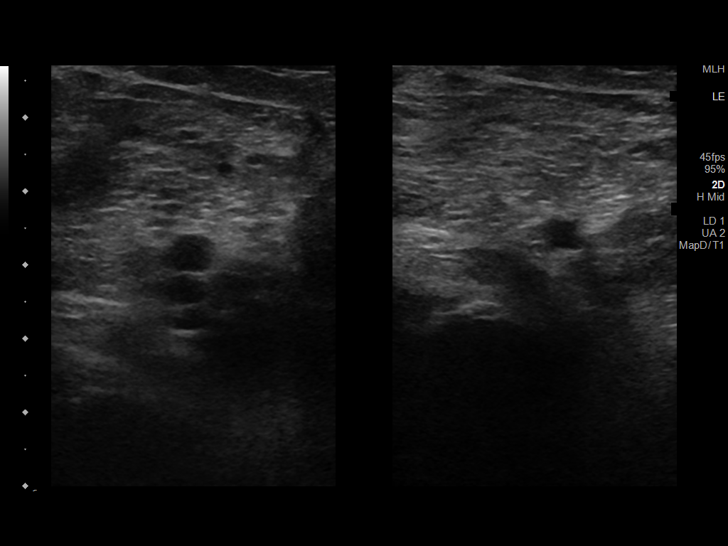
[im 27/37]
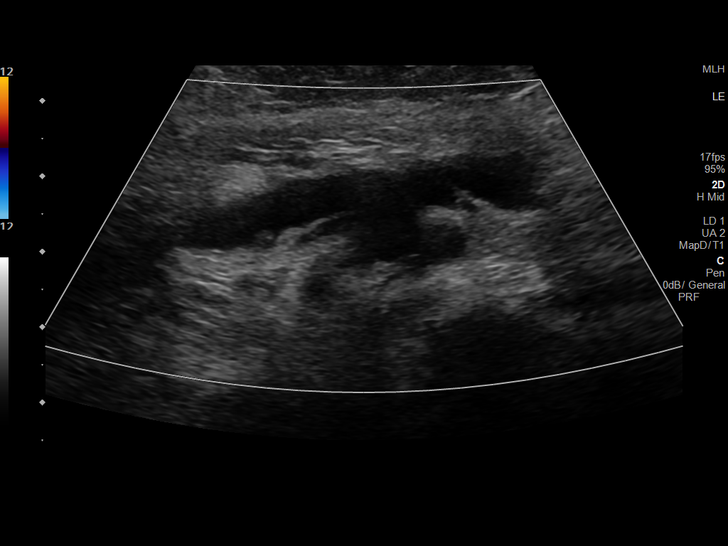
[im 30/37]
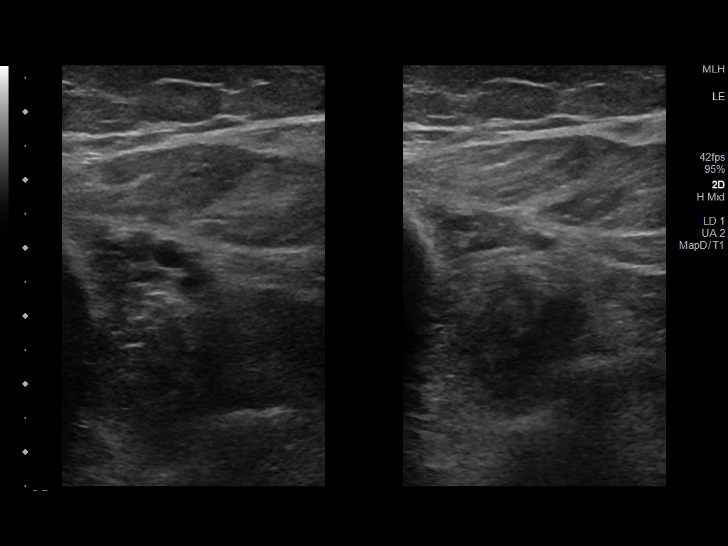
[im 33/37]
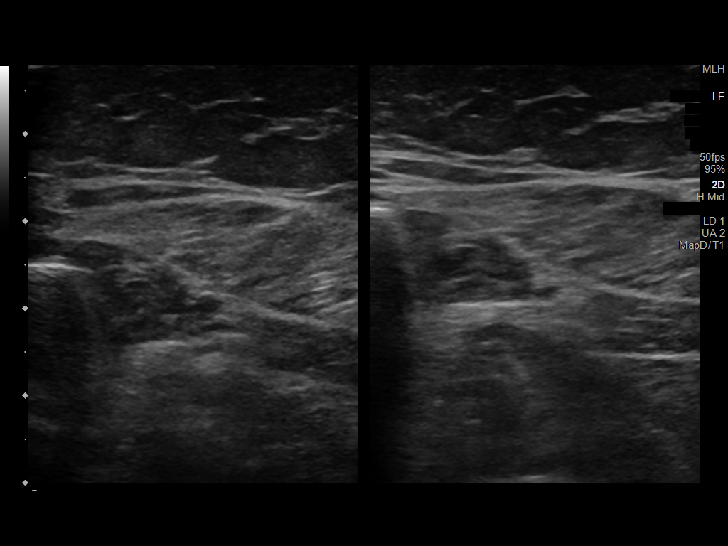
[im 37/37]
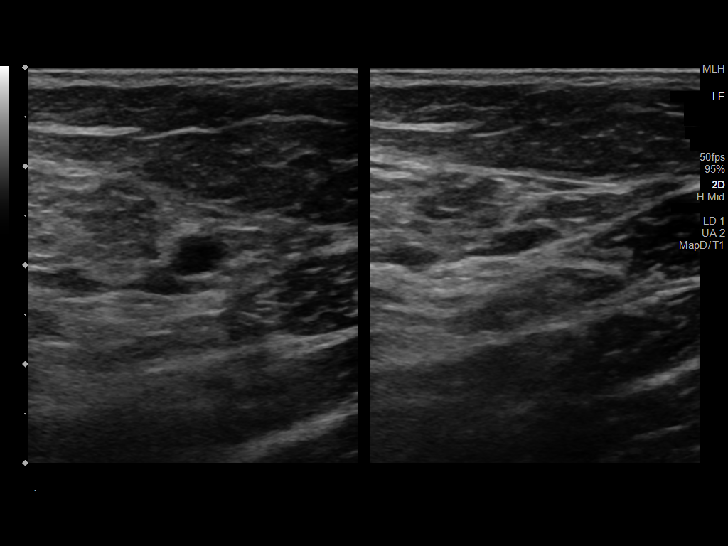

[13 of 24 positions shown; findings below may reference images not displayed]

FINDINGS: Contralateral Common Femoral Vein: Respiratory phasicity is normal
and symmetric with the symptomatic side. No evidence of thrombus.
Normal compressibility.

Common Femoral Vein: No evidence of thrombus. Normal
compressibility, respiratory phasicity and response to augmentation.

Saphenofemoral Junction: No evidence of thrombus. Normal
compressibility and flow on color Doppler imaging.

Profunda Femoral Vein: No evidence of thrombus. Normal
compressibility and flow on color Doppler imaging.

Femoral Vein: No evidence of thrombus. Normal compressibility,
respiratory phasicity and response to augmentation.

Popliteal Vein: No evidence of thrombus. Normal compressibility,
respiratory phasicity and response to augmentation.

Calf Veins: No evidence of thrombus. Normal compressibility and flow
on color Doppler imaging.

Superficial Great Saphenous Vein: No evidence of thrombus. Normal
compressibility.

Venous Reflux:  None.

Other Findings: Irregular hypoechoic collection at the right
popliteal fossa that measures 4.9 x 1.8 x 2.2 cm. There is no
significant vascular flow within this collection. Findings are most
compatible with a mildly complex cystic structure. This structure
measured 4.4 x 1.8 x 2.5 cm on the previous examination.
IMPRESSION: 1.  Negative for deep venous thrombosis in right lower extremity.
2. Minimal change in the right knee Baker's cyst.
